# Patient Record
Sex: Female | Born: 1935
Health system: Southern US, Community
[De-identification: ages and names within clinical notes are randomized; demographics above are authoritative.]

## PROBLEM LIST (undated history)

## (undated) DIAGNOSIS — I471 Supraventricular tachycardia, unspecified: Secondary | ICD-10-CM

## (undated) DIAGNOSIS — C50919 Malignant neoplasm of unspecified site of unspecified female breast: Secondary | ICD-10-CM

## (undated) HISTORY — PX: BACK SURGERY: SHX140

## (undated) HISTORY — DX: Supraventricular tachycardia: I47.1

## (undated) HISTORY — PX: MASTECTOMY: SHX3

## (undated) HISTORY — PX: TUBAL LIGATION: SHX77

## (undated) HISTORY — DX: Supraventricular tachycardia, unspecified: I47.10

## (undated) HISTORY — DX: Malignant neoplasm of unspecified site of unspecified female breast: C50.919

## (undated) HISTORY — PX: TONSILLECTOMY: SUR1361

---

## 2007-02-01 ENCOUNTER — Encounter: Admission: RE | Admit: 2007-02-01 | Discharge: 2007-02-01 | Payer: Self-pay | Admitting: Internal Medicine

## 2007-03-14 ENCOUNTER — Ambulatory Visit: Payer: Self-pay

## 2007-03-14 ENCOUNTER — Ambulatory Visit: Payer: Self-pay | Admitting: Cardiology

## 2007-03-15 ENCOUNTER — Ambulatory Visit: Payer: Self-pay

## 2008-02-26 ENCOUNTER — Encounter: Admission: RE | Admit: 2008-02-26 | Discharge: 2008-02-26 | Payer: Self-pay | Admitting: Internal Medicine

## 2009-03-23 ENCOUNTER — Encounter: Admission: RE | Admit: 2009-03-23 | Discharge: 2009-03-23 | Payer: Self-pay | Admitting: Internal Medicine

## 2010-05-17 ENCOUNTER — Encounter: Admission: RE | Admit: 2010-05-17 | Discharge: 2010-05-17 | Payer: Self-pay | Admitting: Internal Medicine

## 2010-12-28 NOTE — Assessment & Plan Note (Signed)
[facility]                            CARDIOLOGY OFFICE NOTE   Nichole Lawrence, Nichole Lawrence                       MRN:          [id number]  DATE:03/14/2007                            DOB:          08-21-1932    CARDIOLOGY CONSULTATION   REASON FOR CONSULTATION:  Nichole Lawrence comes in today for an exercise  treadmill, ordered by Dr. Nicholos Johns, for exertional chest tightness,  shortness of breath and fatigue.   She has had this for the past several months on four different  occasions.  It happens to her when she is walking outside but not at the  [facility].  It happens about one mile into her walk.   Her cardiac risk factors are pertinent for age.  She has no history of  hypertension, diabetes, hyperlipidemia.  She does not smoke.   Her baseline EKG shows some ST segment depression in the inferior  lateral leads.  We were concerned about doing just the standard  treadmill because of the increased possibility of a false positive.  Her  history is also quite worrisome.   PAST MEDICAL HISTORY:  She has a history of rapid heart beat treated  with Cardizem and digoxin.  The specifics of this are unknown.   ALLERGIES:  In addition to the above, she has no allergy to dye.  She  has an ALLERGY to DEMEROL.   CURRENT MEDICATIONS:  1. Cardizem 360 mg a day.  2. Digoxin 0.125 mg daily.  3. Evista 160 mg daily.  4. Aspirin 81 mg daily.  5. Calcium, multivitamins, vitamin D.   PAST SURGICAL HISTORY:  She had a diskectomy in 1980.  D and C in 1987.  Vein stripping and ligation in 1972 to 1994.  Right modified radical  mastectomy.   FAMILY HISTORY:  Her family history is negative for premature coronary  artery disease.   SOCIAL HISTORY:  She is a retired Astronomer.  She is widowed.  She has two  children.  Her son-in-law is Dr. Hannah Beat, who accompanies her today.   REVIEW OF SYSTEMS:  Other than the HPI, review is negative.   PHYSICAL EXAMINATION:  VITAL SIGNS:   Her exam today demonstrated a blood  pressure of 120/70.  Her pulse was 110 in sinus tachycardia.  EKG shows  right atrial enlargement, poor R wave progression across the anterior  precordium and nonspecific ST segment changes inferolaterally.  Her  height is 5 feet 7 inches, she weighs 135 pounds.  HEENT:  Unremarkable.  NECK:  Carotid upstrokes and equal bilaterally without bruits.  No JVD,  thyroid is not enlarged.  HEART:  Reveals a normal S1 and S2.  LUNGS:  Clear.  EXTREMITIES:  Reveal no edema.  Pulses are intact.   ASSESSMENT:  Exertional dyspnea and fatigue.  This happens outside more  so than it does inside at the Y.  However, with a nonspecific  electrocardiogram, and the significant chance/pretest probability of  having some coronary disease, I think we need a stress Myoview.   PLAN:  I have scheduled this for tomorrow.  If she shows  any degree of  ischemia she will need a heart catheterization.  Otherwise, we will  continue with risk factor modification.     Thomas C. Daleen Squibb, MD, [facility]  Electronically Signed    TCW/MedQ  DD: 03/14/2007  DT: 03/15/2007  Job #: [id number]   cc:   Nichole Lawrence, M.D.

## 2011-06-15 ENCOUNTER — Other Ambulatory Visit: Payer: Self-pay | Admitting: Internal Medicine

## 2011-06-15 DIAGNOSIS — Z9011 Acquired absence of right breast and nipple: Secondary | ICD-10-CM

## 2011-06-15 DIAGNOSIS — Z1231 Encounter for screening mammogram for malignant neoplasm of breast: Secondary | ICD-10-CM

## 2011-07-14 ENCOUNTER — Ambulatory Visit
Admission: RE | Admit: 2011-07-14 | Discharge: 2011-07-14 | Disposition: A | Discharge status: Home / Self Care | Payer: Medicare Other | Source: Ambulatory Visit | Attending: Internal Medicine | Admitting: Internal Medicine

## 2011-07-14 DIAGNOSIS — Z1231 Encounter for screening mammogram for malignant neoplasm of breast: Secondary | ICD-10-CM

## 2011-07-14 DIAGNOSIS — Z9011 Acquired absence of right breast and nipple: Secondary | ICD-10-CM

## 2012-06-21 ENCOUNTER — Other Ambulatory Visit: Payer: Self-pay | Admitting: Internal Medicine

## 2012-06-21 DIAGNOSIS — Z1231 Encounter for screening mammogram for malignant neoplasm of breast: Secondary | ICD-10-CM

## 2012-06-21 DIAGNOSIS — Z9011 Acquired absence of right breast and nipple: Secondary | ICD-10-CM

## 2012-07-27 ENCOUNTER — Ambulatory Visit: Payer: Medicare Other

## 2012-07-27 ENCOUNTER — Ambulatory Visit
Admission: RE | Admit: 2012-07-27 | Discharge: 2012-07-27 | Disposition: A | Discharge status: Home / Self Care | Payer: Medicare Other | Source: Ambulatory Visit | Attending: Internal Medicine | Admitting: Internal Medicine

## 2012-07-27 DIAGNOSIS — Z1231 Encounter for screening mammogram for malignant neoplasm of breast: Secondary | ICD-10-CM

## 2012-07-27 DIAGNOSIS — Z9011 Acquired absence of right breast and nipple: Secondary | ICD-10-CM

## 2014-07-15 ENCOUNTER — Telehealth: Payer: Self-pay | Admitting: Cardiology

## 2014-07-15 NOTE — Telephone Encounter (Signed)
Received records from [facility] (Dr Kathie Dike) for appointment with Dr Stanford Breed on 08/28/14.  Records given to [facility] (medical records) for Dr Jacalyn Lefevre schedule on 08/28/14.  lp

## 2014-07-17 ENCOUNTER — Other Ambulatory Visit (HOSPITAL_COMMUNITY): Payer: Self-pay | Admitting: Internal Medicine

## 2014-07-17 DIAGNOSIS — R0602 Shortness of breath: Secondary | ICD-10-CM

## 2014-07-18 ENCOUNTER — Other Ambulatory Visit (HOSPITAL_COMMUNITY): Payer: Self-pay | Admitting: Internal Medicine

## 2014-07-18 DIAGNOSIS — R0602 Shortness of breath: Secondary | ICD-10-CM

## 2014-07-24 ENCOUNTER — Other Ambulatory Visit (HOSPITAL_COMMUNITY): Payer: Self-pay | Admitting: Cardiology

## 2014-07-24 DIAGNOSIS — R0602 Shortness of breath: Secondary | ICD-10-CM

## 2014-07-25 ENCOUNTER — Encounter (INDEPENDENT_AMBULATORY_CARE_PROVIDER_SITE_OTHER): Payer: Medicare Other

## 2014-07-25 ENCOUNTER — Other Ambulatory Visit: Payer: Self-pay | Admitting: *Deleted

## 2014-07-25 ENCOUNTER — Ambulatory Visit (HOSPITAL_COMMUNITY): Payer: Medicare Other | Attending: Cardiovascular Disease

## 2014-07-25 ENCOUNTER — Encounter: Payer: Self-pay | Admitting: Radiology

## 2014-07-25 ENCOUNTER — Ambulatory Visit (HOSPITAL_COMMUNITY): Payer: Medicare Other

## 2014-07-25 DIAGNOSIS — Z853 Personal history of malignant neoplasm of breast: Secondary | ICD-10-CM | POA: Insufficient documentation

## 2014-07-25 DIAGNOSIS — R0602 Shortness of breath: Secondary | ICD-10-CM

## 2014-07-25 DIAGNOSIS — Z8249 Family history of ischemic heart disease and other diseases of the circulatory system: Secondary | ICD-10-CM | POA: Diagnosis not present

## 2014-07-25 DIAGNOSIS — R002 Palpitations: Secondary | ICD-10-CM

## 2014-07-25 NOTE — Progress Notes (Signed)
[facility] 24hr holter applied.

## 2014-07-25 NOTE — Progress Notes (Signed)
2D Echo completed. 07/25/2014

## 2014-08-25 NOTE — Progress Notes (Signed)
     HPI: 81 year old female for evaluation of chest pain. Patient apparently had a nuclear study in 2008 that was unremarkable. Echocardiogram December 2015 showed normal LV function, mild aortic insufficiency and mild left atrial enlargement. Patient states that for the past 3 months she has had occasional chest tightness. The tightness was substernal without significant radiation. It would occur initially with walking her dog and was relieved with rest. Not pleuritic or positional. She then noticed it with exercise. Towards Christmas she began to notice the symptoms upon awakening. They would last approximately 2 hours and then resolve spontaneously. However she has had absolutely no symptoms since Christmas. She now has no dyspnea on exertion, orthopnea, PND, syncope or exertional chest pain. She did have minimal pedal edema previously but this has resolved. She has not traveled recently.  Current Outpatient Prescriptions  Medication Sig Dispense Refill  . aspirin 81 MG tablet Take 81 mg by mouth daily.    . digoxin (LANOXIN) 0.125 MG tablet Take 0.125 mg by mouth daily.    Marland Kitchen diltiazem (CARDIZEM CD) 360 MG 24 hr capsule Take 360 mg by mouth daily.    Marland Kitchen MAGNESIUM SULFATE PO Take 250 mg by mouth daily.    . raloxifene (EVISTA) 60 MG tablet Take 60 mg by mouth daily.     No current facility-administered medications for this visit.    Allergies  Allergen Reactions  . Demerol [Meperidine]   . Nsaids Rash     Past Medical History  Diagnosis Date  . SVT (supraventricular tachycardia)   . Breast cancer     Past Surgical History  Procedure Laterality Date  . Mastectomy Right   . Back surgery    . Tonsillectomy      History   Social History  . Marital Status: Single    Spouse Name: N/A    Number of Children: 2  . Years of Education: N/A   Occupational History  . Not on file.   Social History Main Topics  . Smoking status: Never Smoker   . Smokeless tobacco: Not on file  .  Alcohol Use: 0.0 oz/week    0 Not specified per week     Comment: Occasional  . Drug Use: Not on file  . Sexual Activity: Not on file   Other Topics Concern  . Not on file   Social History Narrative  . No narrative on file    Family History  Problem Relation Age of Onset  . Heart disease Mother   . CAD Brother     Died of MI at age 75    ROS: no fevers or chills, productive cough, hemoptysis, dysphasia, odynophagia, melena, hematochezia, dysuria, hematuria, rash, seizure activity, orthopnea, PND, pedal edema, claudication. Remaining systems are negative.  Physical Exam:   Blood pressure 150/72, pulse 64, height 5\' 7"  (1.702 m), weight 132 lb 8 oz (60.102 kg).  General:  Well developed/well nourished in NAD Skin warm/dry Patient not depressed No peripheral clubbing Back-normal HEENT-normal/normal eyelids Neck supple/normal carotid upstroke bilaterally; no bruits; no JVD; no thyromegaly chest - CTA/ normal expansion CV - RRR/normal S1 and S2; no murmurs, rubs or gallops;  PMI nondisplaced Abdomen -NT/ND, no HSM, no mass, + bowel sounds, no bruit 2+ femoral pulses, no bruits Ext-no edema, chords, 2+ DP Neuro-grossly nonfocal  ECG sinus rhythm with first-degree AV block. Low voltage. Left anterior fascicular block.

## 2014-08-28 ENCOUNTER — Encounter: Payer: Self-pay | Admitting: *Deleted

## 2014-08-28 ENCOUNTER — Ambulatory Visit (INDEPENDENT_AMBULATORY_CARE_PROVIDER_SITE_OTHER): Payer: Medicare Other | Admitting: Cardiology

## 2014-08-28 ENCOUNTER — Encounter: Payer: Self-pay | Admitting: Cardiology

## 2014-08-28 VITALS — BP 150/72 | HR 64 | Ht 67.0 in | Wt 132.5 lb

## 2014-08-28 DIAGNOSIS — I471 Supraventricular tachycardia: Secondary | ICD-10-CM

## 2014-08-28 DIAGNOSIS — R072 Precordial pain: Secondary | ICD-10-CM

## 2014-08-28 DIAGNOSIS — R079 Chest pain, unspecified: Secondary | ICD-10-CM

## 2014-08-28 NOTE — Assessment & Plan Note (Signed)
Initial description somewhat concerning. However her symptoms have completely resolved and now she can exert herself completely with no chest pain. Electrocardiogram with no diagnostic ST changes. Recent echocardiogram showed normal LV function. We will plan a nuclear study for risk stratification. She really has no risk factors for pulmonary embolus.

## 2014-08-28 NOTE — Assessment & Plan Note (Signed)
Patient had supraventricular tachycardia years ago. She has had no bouts in 30 years. She has a long first-degree AV block. Tinea Cardizem but discontinue digoxin and follow.

## 2014-08-28 NOTE — Patient Instructions (Signed)
Your physician recommends that you schedule a follow-up appointment in: [place] has requested that you have en exercise stress myoview. For further information please visit [email or web address]. Please follow instruction sheet, as given.  TAKE ALL MEDICINES

## 2014-09-02 DIAGNOSIS — R319 Hematuria, unspecified: Secondary | ICD-10-CM | POA: Diagnosis not present

## 2014-09-05 ENCOUNTER — Encounter (HOSPITAL_COMMUNITY): Payer: Medicare Other

## 2014-09-10 DIAGNOSIS — R072 Precordial pain: Secondary | ICD-10-CM | POA: Diagnosis not present

## 2014-09-10 DIAGNOSIS — R319 Hematuria, unspecified: Secondary | ICD-10-CM | POA: Diagnosis not present

## 2014-09-10 DIAGNOSIS — M150 Primary generalized (osteo)arthritis: Secondary | ICD-10-CM | POA: Diagnosis not present

## 2014-09-10 DIAGNOSIS — M810 Age-related osteoporosis without current pathological fracture: Secondary | ICD-10-CM | POA: Diagnosis not present

## 2014-09-11 ENCOUNTER — Telehealth (HOSPITAL_COMMUNITY): Payer: Self-pay

## 2014-09-11 NOTE — Telephone Encounter (Signed)
Encounter complete. 

## 2014-09-16 ENCOUNTER — Ambulatory Visit (HOSPITAL_COMMUNITY)
Admission: RE | Admit: 2014-09-16 | Discharge: 2014-09-16 | Disposition: A | Discharge status: Home / Self Care | Payer: Medicare Other | Source: Ambulatory Visit | Attending: Cardiovascular Disease | Admitting: Cardiovascular Disease

## 2014-09-16 ENCOUNTER — Encounter: Payer: Self-pay | Admitting: Cardiology

## 2014-09-16 DIAGNOSIS — R9431 Abnormal electrocardiogram [ECG] [EKG]: Secondary | ICD-10-CM | POA: Diagnosis not present

## 2014-09-16 DIAGNOSIS — R079 Chest pain, unspecified: Secondary | ICD-10-CM

## 2014-09-16 DIAGNOSIS — R0602 Shortness of breath: Secondary | ICD-10-CM | POA: Insufficient documentation

## 2014-09-16 DIAGNOSIS — R42 Dizziness and giddiness: Secondary | ICD-10-CM | POA: Diagnosis not present

## 2014-09-16 DIAGNOSIS — Z8249 Family history of ischemic heart disease and other diseases of the circulatory system: Secondary | ICD-10-CM | POA: Insufficient documentation

## 2014-09-16 DIAGNOSIS — R5383 Other fatigue: Secondary | ICD-10-CM | POA: Diagnosis not present

## 2014-09-16 MED ORDER — TECHNETIUM TC 99M SESTAMIBI GENERIC - CARDIOLITE
31.8000 | Freq: Once | INTRAVENOUS | Status: AC | PRN
  Administered 2014-09-16: 31.8 via INTRAVENOUS

## 2014-09-16 MED ORDER — TECHNETIUM TC 99M SESTAMIBI GENERIC - CARDIOLITE
10.9000 | Freq: Once | INTRAVENOUS | Status: AC | PRN
  Administered 2014-09-16: 10.9 via INTRAVENOUS

## 2014-09-16 NOTE — Procedures (Addendum)
[facility] [address] [place] [address] [phone]  Cardiology Nuclear Med Study  Nichole Lawrence is a 81 y.o. female     MRN : [id number]     DOB: [birth year]  Procedure Date: 09/16/2014  Nuclear Med Background Indication for Stress Test:  Evaluation for Ischemia and Abnormal EKG History:  SVT;Pt states no prior NUC MPI for comparisson. Cardiac Risk Factors: Family History - CAD  Symptoms:  Chest Pain, DOE, Fatigue, Light-Headedness and SOB   Nuclear Pre-Procedure Caffeine/Decaff Intake:  12:30am NPO After: 10:30am   IV Site: L Forearm  IV 0.9% NS with Angio Cath:  22g  Chest Size (in):  n/a IV Started by: Rolene Course, RN  Height: 5\' 7"  (1.702 m)  Cup Size: B;Pt is s/p R mastectomy/lymphectomy; NO BP OR IV RIGHT SIDE.  BMI:  Body mass index is 20.67 kg/(m^2). Weight:  132 lb (59.875 kg)   Tech Comments:  n/a    Nuclear Med Study 1 or 2 day study: 1 day  Stress Test Type:  Stress  Order Authorizing Provider:  Kirk Ruths, MD   Resting Radionuclide: Technetium 28m Sestamibi  Resting Radionuclide Dose: 10.9 mCi   Stress Radionuclide:  Technetium 58m Sestamibi  Stress Radionuclide Dose: 31.8 mCi           Stress Protocol Rest HR: 95 Stress HR: 134  Rest BP: 137/80 Stress BP: 168/76  Exercise Time (min): 6 METS: 7.0   Predicted Max HR: 140 bpm % Max HR: 95.71 bpm Rate Pressure Product: 22512  Dose of Adenosine (mg):  n/a Dose of Lexiscan: n/a mg  Dose of Atropine (mg): n/a Dose of Dobutamine: n/a mcg/kg/min (at max HR)  Stress Test Technologist: Leane Para, CCT Nuclear Technologist: Imagene Riches, CNMT   Rest Procedure:  Myocardial perfusion imaging was performed at rest 45 minutes following the intravenous administration of Technetium 24m Sestamibi. Stress Procedure:  The patient performed treadmill exercise using a Bruce  Protocol for 6 minutes. The patient stopped due to Fatigue and SOB and denied  any chest pain.  There were no significant ST-T wave changes.  Technetium 71m Sestamibi was injected IV at peak exercise and myocardial perfusion imaging was performed after a brief delay.  Transient Ischemic Dilatation (Normal <1.22):  0.97  QGS EDV:  78 ml QGS ESV:  28 ml LV Ejection Fraction: 64%       Rest ECG: NSR - Normal EKG  Stress ECG: No significant change from baseline ECG  QPS Raw Data Images:  Normal; no motion artifact; normal heart/lung ratio. Stress Images:  Normal homogeneous uptake in all areas of the myocardium. Rest Images:  Normal homogeneous uptake in all areas of the myocardium. Subtraction (SDS):  No evidence of ischemia.  Impression Exercise Capacity:  Good exercise capacity. BP Response:  Normal blood pressure response. Clinical Symptoms:  No significant symptoms noted. ECG Impression:  No significant ST segment change suggestive of ischemia. Comparison with Prior Nuclear Study: No images to compare  Overall Impression:  Normal stress nuclear study.  LV Wall Motion:  NL LV Function; NL Wall Motion   Lorretta Harp, MD  09/16/2014 5:01 PM

## 2014-11-26 NOTE — Progress Notes (Signed)
      HPI: FU chest pain. Patient apparently had a nuclear study in 2008 that was unremarkable. Echocardiogram December 2015 showed normal LV function, mild aortic insufficiency and mild left atrial enlargement. Nuclear study February 2016 showed normal LV function and no ischemia or infarction. Since last seen the patient denies any dyspnea on exertion, orthopnea, PND, pedal edema, palpitations, syncope or exertional chest pain. She occasionally has a vague uncomfortable feeling in her chest in the morning that she attributes to anxiety. Not pleuritic or positional and not exertional.   Current Outpatient Prescriptions  Medication Sig Dispense Refill  . aspirin 81 MG tablet Take 81 mg by mouth daily.    Marland Kitchen diltiazem (CARDIZEM CD) 360 MG 24 hr capsule Take 360 mg by mouth daily.    Marland Kitchen MAGNESIUM SULFATE PO Take 250 mg by mouth daily.    . raloxifene (EVISTA) 60 MG tablet Take 60 mg by mouth daily.     No current facility-administered medications for this visit.     Past Medical History  Diagnosis Date  . SVT (supraventricular tachycardia)   . Breast cancer     Past Surgical History  Procedure Laterality Date  . Mastectomy Right   . Back surgery    . Tonsillectomy      History   Social History  . Marital Status: Single    Spouse Name: N/A  . Number of Children: 2  . Years of Education: N/A   Occupational History  . Not on file.   Social History Main Topics  . Smoking status: Never Smoker   . Smokeless tobacco: Not on file  . Alcohol Use: 0.0 oz/week    0 Standard drinks or equivalent per week     Comment: Occasional  . Drug Use: Not on file  . Sexual Activity: Not on file   Other Topics Concern  . Not on file   Social History Narrative    ROS: no fevers or chills, productive cough, hemoptysis, dysphasia, odynophagia, melena, hematochezia, dysuria, hematuria, rash, seizure activity, orthopnea, PND, pedal edema, claudication. Remaining systems are  negative.  Physical Exam: Well-developed well-nourished in no acute distress.  Skin is warm and dry.  HEENT is normal.  Neck is supple.  Chest is clear to auscultation with normal expansion.  Cardiovascular exam is regular rate and rhythm.  Abdominal exam nontender or distended. No masses palpated. Extremities show no edema. neuro grossly intact  Electrocardiogram shows sinus rhythm, first-degree AV block, left anterior fascicular block, prior septal infarct cannot be excluded.

## 2014-11-27 ENCOUNTER — Ambulatory Visit (INDEPENDENT_AMBULATORY_CARE_PROVIDER_SITE_OTHER): Payer: Medicare Other | Admitting: Cardiology

## 2014-11-27 ENCOUNTER — Encounter: Payer: Self-pay | Admitting: Cardiology

## 2014-11-27 VITALS — BP 128/60 | HR 68 | Ht 66.5 in | Wt 129.2 lb

## 2014-11-27 DIAGNOSIS — R072 Precordial pain: Secondary | ICD-10-CM | POA: Diagnosis not present

## 2014-11-27 DIAGNOSIS — I471 Supraventricular tachycardia: Secondary | ICD-10-CM | POA: Diagnosis not present

## 2014-11-27 NOTE — Patient Instructions (Signed)
Your physician wants you to follow-up in: 6 MONTHS WITH DR CRENSHAW You will receive a reminder letter in the mail two months in advance. If you don't receive a letter, please call our office to schedule the follow-up appointment.  

## 2014-11-27 NOTE — Assessment & Plan Note (Addendum)
Occasional vague uncomfortable feeling in the morning that she attributes to stress but no exertional chest pain. Electrocardiogram shows no ST changes. Nuclear study normal. Will not pursue further cardiac evaluation at this point. Follow-up in 6 months to make sure symptoms stable.

## 2014-11-27 NOTE — Assessment & Plan Note (Signed)
No recent episodes. Continue Cardizem.

## 2015-02-09 ENCOUNTER — Other Ambulatory Visit: Payer: Self-pay

## 2015-03-04 DIAGNOSIS — C4441 Basal cell carcinoma of skin of scalp and neck: Secondary | ICD-10-CM | POA: Diagnosis not present

## 2015-03-04 DIAGNOSIS — Z85828 Personal history of other malignant neoplasm of skin: Secondary | ICD-10-CM | POA: Diagnosis not present

## 2015-03-04 DIAGNOSIS — L820 Inflamed seborrheic keratosis: Secondary | ICD-10-CM | POA: Diagnosis not present

## 2015-03-04 DIAGNOSIS — L570 Actinic keratosis: Secondary | ICD-10-CM | POA: Diagnosis not present

## 2015-03-04 DIAGNOSIS — L918 Other hypertrophic disorders of the skin: Secondary | ICD-10-CM | POA: Diagnosis not present

## 2015-03-04 DIAGNOSIS — C44319 Basal cell carcinoma of skin of other parts of face: Secondary | ICD-10-CM | POA: Diagnosis not present

## 2015-03-04 DIAGNOSIS — D225 Melanocytic nevi of trunk: Secondary | ICD-10-CM | POA: Diagnosis not present

## 2015-03-04 DIAGNOSIS — D692 Other nonthrombocytopenic purpura: Secondary | ICD-10-CM | POA: Diagnosis not present

## 2015-03-17 DIAGNOSIS — Z1231 Encounter for screening mammogram for malignant neoplasm of breast: Secondary | ICD-10-CM | POA: Diagnosis not present

## 2015-03-18 DIAGNOSIS — Z780 Asymptomatic menopausal state: Secondary | ICD-10-CM | POA: Diagnosis not present

## 2015-03-18 DIAGNOSIS — R319 Hematuria, unspecified: Secondary | ICD-10-CM | POA: Diagnosis not present

## 2015-03-25 DIAGNOSIS — I471 Supraventricular tachycardia: Secondary | ICD-10-CM | POA: Diagnosis not present

## 2015-03-25 DIAGNOSIS — R319 Hematuria, unspecified: Secondary | ICD-10-CM | POA: Diagnosis not present

## 2015-05-28 DIAGNOSIS — H5213 Myopia, bilateral: Secondary | ICD-10-CM | POA: Diagnosis not present

## 2015-06-08 ENCOUNTER — Ambulatory Visit: Payer: Medicare Other | Admitting: Cardiology

## 2015-06-16 NOTE — Progress Notes (Signed)
      HPI: FU chest pain. Patient apparently had a nuclear study in 2008 that was unremarkable. Echocardiogram December 2015 showed normal LV function, mild aortic insufficiency and mild left atrial enlargement. Nuclear study February 2016 showed normal LV function and no ischemia or infarction. Since last seen Patient has developed recurrent chest pain. It is described as a tightness in her left chest. It is not pleuritic, positional, related to food. It can occur both with exertion and at rest. Typically last 30 minutes and resolves spontaneously. No associated water brash. No associated nausea or diaphoresis or dyspnea. She feels fatigued afterwards. Note there are no associated palpitations similar to symptoms she's had with SVT in the past. Otherwise no dyspnea on exertion, orthopnea, PND or pedal edema. No syncope.  Current Outpatient Prescriptions  Medication Sig Dispense Refill  . aspirin 81 MG tablet Take 81 mg by mouth daily.    Marland Kitchen diltiazem (CARDIZEM CD) 360 MG 24 hr capsule Take 360 mg by mouth daily.    Marland Kitchen MAGNESIUM SULFATE PO Take 250 mg by mouth daily.    . raloxifene (EVISTA) 60 MG tablet Take 60 mg by mouth daily.     No current facility-administered medications for this visit.     Past Medical History  Diagnosis Date  . SVT (supraventricular tachycardia) ([place])   . Breast cancer [facility])     Past Surgical History  Procedure Laterality Date  . Mastectomy Right   . Back surgery    . Tonsillectomy      Social History   Social History  . Marital Status: Single    Spouse Name: N/A  . Number of Children: 2  . Years of Education: N/A   Occupational History  . Not on file.   Social History Main Topics  . Smoking status: Never Smoker   . Smokeless tobacco: Not on file  . Alcohol Use: 0.0 oz/week    0 Standard drinks or equivalent per week     Comment: Occasional  . Drug Use: Not on file  . Sexual Activity: Not on file   Other Topics Concern  . Not on file    Social History Narrative    ROS: no fevers or chills, productive cough, hemoptysis, dysphasia, odynophagia, melena, hematochezia, dysuria, hematuria, rash, seizure activity, orthopnea, PND, pedal edema, claudication. Remaining systems are negative.  Physical Exam: Well-developed well-nourished in no acute distress.  Skin is warm and dry.  HEENT is normal.  Neck is supple.  Chest is clear to auscultation with normal expansion.  Cardiovascular exam is regular rate and rhythm.  Abdominal exam nontender or distended. No masses palpated. Extremities show no edema. neuro grossly intact  ECG Sinus rhythm, first-degree AV block, left anterior fascicular block, cannot rule out prior septal infarct.

## 2015-06-19 ENCOUNTER — Telehealth: Payer: Self-pay | Admitting: *Deleted

## 2015-06-19 ENCOUNTER — Ambulatory Visit (INDEPENDENT_AMBULATORY_CARE_PROVIDER_SITE_OTHER): Payer: Medicare Other | Admitting: Cardiology

## 2015-06-19 ENCOUNTER — Telehealth: Payer: Self-pay | Admitting: Cardiology

## 2015-06-19 ENCOUNTER — Encounter: Payer: Self-pay | Admitting: Cardiology

## 2015-06-19 ENCOUNTER — Ambulatory Visit (HOSPITAL_COMMUNITY)
Admission: RE | Admit: 2015-06-19 | Discharge: 2015-06-19 | Disposition: A | Discharge status: Home / Self Care | Payer: Medicare Other | Source: Ambulatory Visit | Attending: Cardiology | Admitting: Cardiology

## 2015-06-19 VITALS — BP 130/60 | HR 77 | Ht 66.5 in | Wt 130.7 lb

## 2015-06-19 DIAGNOSIS — R072 Precordial pain: Secondary | ICD-10-CM | POA: Insufficient documentation

## 2015-06-19 DIAGNOSIS — I471 Supraventricular tachycardia, unspecified: Secondary | ICD-10-CM

## 2015-06-19 DIAGNOSIS — R918 Other nonspecific abnormal finding of lung field: Secondary | ICD-10-CM | POA: Diagnosis not present

## 2015-06-19 DIAGNOSIS — R0602 Shortness of breath: Secondary | ICD-10-CM | POA: Diagnosis not present

## 2015-06-19 NOTE — Telephone Encounter (Signed)
Spoke with pt, aware of results. She will come to the office Monday for lab work

## 2015-06-19 NOTE — Patient Instructions (Signed)
Medication Instructions:   NO CHANGE  Testing/Procedures:  A chest x-ray takes a picture of the organs and structures inside the chest, including the heart, lungs, and blood vessels. This test can show several things, including, whether the heart is enlarges; whether fluid is building up in the lungs; and whether pacemaker / defibrillator leads are still in place. AT [facility]  Follow-Up:  Your physician recommends that you schedule a follow-up appointment in: [place]   If you need a refill on your cardiac medications before your next appointment, please call your pharmacy.

## 2015-06-19 NOTE — Assessment & Plan Note (Signed)
No recent episodes. Continue Cardizem. Note her chest pain is not associated with palpitations that she had with SVT in the past.

## 2015-06-19 NOTE — Telephone Encounter (Signed)
Cassandra called in stating that she has the results to the pt's chest x-ray that he had today. Please f/u with her  Thanks

## 2015-06-19 NOTE — Telephone Encounter (Signed)
-----   Message from Lelon Perla, MD sent at 06/19/2015  1:33 PM EDT ----- Check bmet and if renal function normal arrange chest ct with contrast Kirk Ruths

## 2015-06-19 NOTE — Telephone Encounter (Signed)
Called back, no answer when dialed.  Results already reviewed by Dr. Stanford Breed w/ recommended orders relayed to RN.

## 2015-06-19 NOTE — Assessment & Plan Note (Signed)
Patient has now developed recurrent chest pain similar to her symptoms last year. Previous nuclear study was normal. Etiology of symptoms unclear to me. Not classic for angina but cannot completely exclude. I suggested we proceed with cardiac catheterization for definitive evaluation. The risks and benefits were discussed. She is hesitant and did not want to pursue at this point but wanted to consider and discuss with her family. I also explained the risk of undiagnosed coronary disease. She still would like to wait at this point. I will check a chest x-ray. Note symptoms do not clearly sound GI related

## 2015-06-22 DIAGNOSIS — R072 Precordial pain: Secondary | ICD-10-CM | POA: Diagnosis not present

## 2015-06-23 ENCOUNTER — Other Ambulatory Visit: Payer: Self-pay | Admitting: *Deleted

## 2015-06-23 DIAGNOSIS — R9389 Abnormal findings on diagnostic imaging of other specified body structures: Secondary | ICD-10-CM

## 2015-06-23 LAB — BASIC METABOLIC PANEL
BUN: 12 mg/dL (ref 7–25)
CALCIUM: 9.6 mg/dL (ref 8.6–10.4)
CO2: 27 mmol/L (ref 20–31)
Chloride: 103 mmol/L (ref 98–110)
Creat: 0.72 mg/dL (ref 0.60–0.88)
Glucose, Bld: 88 mg/dL (ref 65–99)
Potassium: 4.2 mmol/L (ref 3.5–5.3)
SODIUM: 139 mmol/L (ref 135–146)

## 2015-06-23 NOTE — Telephone Encounter (Signed)
Labs are normal.  pt aware of results  Order for CT placed and sent for scheduling.

## 2015-06-29 ENCOUNTER — Other Ambulatory Visit: Payer: Medicare Other

## 2015-07-01 ENCOUNTER — Ambulatory Visit (INDEPENDENT_AMBULATORY_CARE_PROVIDER_SITE_OTHER)
Admission: RE | Admit: 2015-07-01 | Discharge: 2015-07-01 | Disposition: A | Discharge status: Home / Self Care | Payer: Medicare Other | Source: Ambulatory Visit | Attending: Cardiology | Admitting: Cardiology

## 2015-07-01 ENCOUNTER — Inpatient Hospital Stay: Admission: RE | Admit: 2015-07-01 | Payer: Medicare Other | Source: Ambulatory Visit

## 2015-07-01 DIAGNOSIS — R9389 Abnormal findings on diagnostic imaging of other specified body structures: Secondary | ICD-10-CM

## 2015-07-01 DIAGNOSIS — R918 Other nonspecific abnormal finding of lung field: Secondary | ICD-10-CM | POA: Diagnosis not present

## 2015-07-01 DIAGNOSIS — R938 Abnormal findings on diagnostic imaging of other specified body structures: Secondary | ICD-10-CM

## 2015-07-01 MED ORDER — IOHEXOL 300 MG/ML  SOLN
80.0000 mL | Freq: Once | INTRAMUSCULAR | Status: AC | PRN
  Administered 2015-07-01: 80 mL via INTRAVENOUS

## 2015-07-02 ENCOUNTER — Telehealth: Payer: Self-pay | Admitting: *Deleted

## 2015-07-02 DIAGNOSIS — R9389 Abnormal findings on diagnostic imaging of other specified body structures: Secondary | ICD-10-CM

## 2015-07-02 NOTE — Telephone Encounter (Signed)
-----   Message from Lelon Perla, MD sent at 07/01/2015  4:05 PM EST ----- Pulmonary evaluation Kirk Ruths

## 2015-07-21 DIAGNOSIS — H18412 Arcus senilis, left eye: Secondary | ICD-10-CM | POA: Diagnosis not present

## 2015-07-21 DIAGNOSIS — H2511 Age-related nuclear cataract, right eye: Secondary | ICD-10-CM | POA: Diagnosis not present

## 2015-07-21 DIAGNOSIS — H2512 Age-related nuclear cataract, left eye: Secondary | ICD-10-CM | POA: Diagnosis not present

## 2015-07-21 DIAGNOSIS — H18411 Arcus senilis, right eye: Secondary | ICD-10-CM | POA: Diagnosis not present

## 2015-07-27 ENCOUNTER — Ambulatory Visit: Payer: Medicare Other | Admitting: Cardiology

## 2015-07-28 ENCOUNTER — Encounter: Payer: Self-pay | Admitting: Internal Medicine

## 2015-07-28 ENCOUNTER — Telehealth: Payer: Self-pay | Admitting: *Deleted

## 2015-07-28 ENCOUNTER — Ambulatory Visit (INDEPENDENT_AMBULATORY_CARE_PROVIDER_SITE_OTHER): Payer: Medicare Other | Admitting: Internal Medicine

## 2015-07-28 VITALS — BP 112/70 | HR 74 | Ht 67.0 in | Wt 132.0 lb

## 2015-07-28 DIAGNOSIS — R0600 Dyspnea, unspecified: Secondary | ICD-10-CM | POA: Diagnosis not present

## 2015-07-28 DIAGNOSIS — R0609 Other forms of dyspnea: Secondary | ICD-10-CM

## 2015-07-28 DIAGNOSIS — R918 Other nonspecific abnormal finding of lung field: Secondary | ICD-10-CM

## 2015-07-28 NOTE — Assessment & Plan Note (Addendum)
07/28/2015  Walked RA x 3 laps @ 185 ft each stopped due to  End of study, very brisk  pace, no sob or desat    Symptoms are markedly disproportionate to objective findings and not clear this is a lung problem but pt does appear to have difficult airway management issues.   DDX of  difficult airways management all start with A and  include Adherence, Ace Inhibitors, Acid Reflux, Active Sinus Disease, Alpha 1 Antitripsin deficiency, Anxiety masquerading as Airways dz,  ABPA,  allergy(esp in young), Aspiration (esp in elderly), Adverse effects of meds,  Active smokers, A bunch of PE's (a small clot burden can't cause this syndrome unless there is already severe underlying pulm or vascular dz with poor reserve) plus two Bs  = Bronchiectasis and Beta blocker use..and one C= CHF   Adherence is always the initial "prime suspect" and is a multilayered concern that requires a "trust but verify" approach in every patient - starting with knowing how to use medications, especially inhalers, correctly, keeping up with refills and understanding the fundamental difference between maintenance and prns vs those medications only taken for a very short course and then stopped and not refilled.   ? Acid (or non-acid) GERD > always difficult to exclude as up to 75% of pts in some series report no assoc GI/ Heartburn symptoms> rec max (24h)  acid suppression and diet restrictions/ reviewed and instructions given in writing.   ? Allergies/ asthma unlikely  >  Full pft all that's needed her to complete the w/u  ? chf / ischemia > f/u Dr Stanford Breed planned   Total time devoted to counseling  = 35/60 m review case with pt/ discussion of options/alternatives/ giving and going over instructions (see avs)

## 2015-07-28 NOTE — Telephone Encounter (Signed)
-----   Message from Tanda Rockers, MD sent at 07/28/2015  1:08 PM EST -----  Based on Dr Jacalyn Lefevre notes gerd is much higher on ddx > rec Try prilosec otc 20mg   Take 30-60 min before first meal of the day and Pepcid ac (famotidine) 20 mg one @  bedtime x 4 weeks whether she thinks she needs it or not and no mint/menthol / chocolate or red wine or oil based vitamins

## 2015-07-28 NOTE — Progress Notes (Signed)
Subjective:    Patient ID: Nichole Lawrence, female    DOB: 1932/08/20,    MRN: [id number]  HPI  20 yowf never smoker with new doe x 2012 on her 4 mile walk and gradually worse to point where could only do slow walk x mile and a half so underwent cardiac eval by Nichole Lawrence which was neg so referred 07/28/2015 to pulmonary clinic.   07/28/2015 1st [facility] office visit/ Nichole Lawrence   Chief Complaint  Patient presents with  . Pulmonary Consult    Referred by Dr. Stanford Lawrence. Pt c/o chest pressure and SOB for the past 4 yrs- worse for the past year. She states that with minimal exertion such as taking a shower, she gets exhausted and "feels like not getting enough air".    no longer able to do aerobic class full 55 min, stops early due to fatigue/ sob/chest tight  and  takes up to 30 min to recover p ex assoc with mild chest heaviness  / says did not reproduce on treadmill though report says stopped p 6 min due to fatigue and sob - hx by dr Nichole Lawrence slt different in chest discomfort occurring with and without ex but not the sob, which she assured me only with exertion.  No obvious other patterns in day to day or daytime variabilty or assoc chronic cough or classically pleuritic or ex cp  subjective wheeze overt sinus or hb symptoms. No unusual exp hx or h/o childhood pna/ asthma or knowledge of premature birth.  Sleeping ok without nocturnal  or early am exacerbation  of respiratory  c/o's or need for noct saba. Also denies any obvious fluctuation of symptoms with weather or environmental changes or other aggravating or alleviating factors except as outlined above   Current Medications, Allergies, Complete Past Medical History, Past Surgical History, Family History, and Social History were reviewed in [employer] record.           Review of Systems  Constitutional: Negative for fever, chills and unexpected weight change.  HENT: Negative for congestion, dental problem, ear  pain, nosebleeds, postnasal drip, rhinorrhea, sinus pressure, sneezing, sore throat, trouble swallowing and voice change.   Eyes: Negative for visual disturbance.  Respiratory: Positive for shortness of breath. Negative for cough and choking.   Cardiovascular: Negative for chest pain and leg swelling.  Gastrointestinal: Negative for vomiting, abdominal pain and diarrhea.  Genitourinary: Negative for difficulty urinating.  Musculoskeletal: Negative for arthralgias.  Skin: Negative for rash.  Neurological: Negative for tremors, syncope and headaches.  Hematological: Does not bruise/bleed easily.       Objective:   Physical Exam   amb wf / mild pseudowheeze supine but not with rapid walk   Wt Readings from Last 3 Encounters:  07/28/15 132 lb (59.875 kg)  06/19/15 130 lb 11.2 oz (59.285 kg)  11/27/14 129 lb 3.2 oz (58.605 kg)    Vital signs reviewed   HEENT: nl dentition, turbinates, and oropharynx. Nl external ear canals without cough reflex   NECK :  without JVD/Nodes/TM/ nl carotid upstrokes bilaterally   LUNGS: no acc muscle use,  Nl contour chest which is clear to A and P bilaterally without cough on insp or exp maneuvers   CV:  RRR  no s3 or murmur or increase in P2, no edema   ABD:  soft and nontender with nl inspiratory excursion in the supine position. No bruits or organomegaly, bowel sounds nl  MS:  Nl gait/ ext warm  without deformities, calf tenderness, cyanosis or clubbing No obvious joint restrictions   SKIN: warm and dry without lesions    NEURO:  alert, approp, nl sensorium with  no motor deficits    I personally reviewed images and agree with radiology impression as follows:  CT Chest   07/01/15 Multifocal tree-in-bud nodularity, as described above, suggesting chronic atypical mycobacterial infections such as MAI.        Assessment & Plan:

## 2015-07-28 NOTE — Assessment & Plan Note (Signed)
Confirmed on CT chest 06/21/15 c/w MAI   Although there are clearly abnormalities on CT scan, they should probably be considered "microscopic" as most of the change is not apparent on plain cxr and very unlikley to have anything at all to do with symptoms.   Should she develop a chronic cough or unexplained ns/wt loss then we might consider fob/bal for dx but even then unlikely to rec any chronic rx except as a last resort as so many elderly pts do worse on meds than off  Discussed in detail all the  indications, usual  risks and alternatives  relative to the benefits with patient who agrees to proceed with conservative f/u as outlined

## 2015-07-28 NOTE — Telephone Encounter (Signed)
Spoke with the pt and notified of recs per MW  She verbalized understanding and denied any questions 

## 2015-07-28 NOTE — Patient Instructions (Addendum)
Schedule for pfts next available - ok to do at [facility]   If your breathing problem persists after your cardiac evaluation is complete you need a CPST - call Libby at [phone] to schedule  Late add:  Based on Dr Jacalyn Lefevre notes gerd is much higher on ddx > rec Try prilosec otc 20mg   Take 30-60 min before first meal of the day and Pepcid ac (famotidine) 20 mg one @  bedtime x 4 weeks whether she thinks she needs it or not and no mint/menthol / chocolate or red wine or oil based vitamins       x

## 2015-07-30 ENCOUNTER — Ambulatory Visit (HOSPITAL_COMMUNITY)
Admission: RE | Admit: 2015-07-30 | Discharge: 2015-07-30 | Disposition: A | Discharge status: Home / Self Care | Payer: Medicare Other | Source: Ambulatory Visit | Attending: Internal Medicine | Admitting: Internal Medicine

## 2015-07-30 DIAGNOSIS — R0600 Dyspnea, unspecified: Secondary | ICD-10-CM | POA: Diagnosis not present

## 2015-07-30 LAB — PULMONARY FUNCTION TEST
DL/VA % PRED: 72 %
DL/VA: 3.71 ml/min/mmHg/L
DLCO unc % pred: 54 %
DLCO unc: 15.56 ml/min/mmHg
FEF 25-75 Post: 1.44 L/sec
FEF 25-75 Pre: 1.32 L/sec
FEF2575-%Change-Post: 9 %
FEF2575-%Pred-Post: 93 %
FEF2575-%Pred-Pre: 85 %
FEV1-%CHANGE-POST: 2 %
FEV1-%PRED-POST: 90 %
FEV1-%PRED-PRE: 88 %
FEV1-PRE: 1.94 L
FEV1-Post: 2 L
FEV1FVC-%Change-Post: 2 %
FEV1FVC-%Pred-Pre: 97 %
FEV6-%Change-Post: 1 %
FEV6-%PRED-PRE: 94 %
FEV6-%Pred-Post: 96 %
FEV6-POST: 2.69 L
FEV6-PRE: 2.65 L
FEV6FVC-%Change-Post: 1 %
FEV6FVC-%PRED-POST: 105 %
FEV6FVC-%Pred-Pre: 104 %
FVC-%CHANGE-POST: 0 %
FVC-%PRED-PRE: 90 %
FVC-%Pred-Post: 91 %
FVC-POST: 2.69 L
FVC-PRE: 2.68 L
POST FEV6/FVC RATIO: 100 %
PRE FEV6/FVC RATIO: 99 %
Post FEV1/FVC ratio: 74 %
Pre FEV1/FVC ratio: 72 %
RV % pred: 149 %
RV: 3.83 L
TLC % PRED: 117 %
TLC: 6.49 L

## 2015-07-30 MED ORDER — ALBUTEROL SULFATE (2.5 MG/3ML) 0.083% IN NEBU
2.5000 mg | INHALATION_SOLUTION | Freq: Once | RESPIRATORY_TRACT | Status: AC
  Administered 2015-07-30: 2.5 mg via RESPIRATORY_TRACT

## 2015-08-19 NOTE — Progress Notes (Signed)
      HPI: FU chest pain. Echocardiogram December 2015 showed normal LV function, mild aortic insufficiency and mild left atrial enlargement. Nuclear study February 2016 showed normal LV function and no ischemia or infarction. At last office visit patient was complaining of chest pain. I recommended cardiac catheterization but the patient declined at that point. Chest CT November 2016 was suggestive of Mycobacterium avium intracellular. Seen by pulmonary. Since last seen Patient denies dyspnea on exertion, orthopnea, PND, pedal edema, syncope or exertional chest pain.  Current Outpatient Prescriptions  Medication Sig Dispense Refill  . aspirin 81 MG tablet Take 81 mg by mouth daily.    Marland Kitchen diltiazem (CARDIZEM CD) 360 MG 24 hr capsule Take 360 mg by mouth daily.    Marland Kitchen MAGNESIUM SULFATE PO Take 250 mg by mouth daily.    . Multiple Vitamin (MULTIVITAMIN) capsule Take 1 capsule by mouth daily.    . raloxifene (EVISTA) 60 MG tablet Take 60 mg by mouth daily.     No current facility-administered medications for this visit.     Past Medical History  Diagnosis Date  . SVT (supraventricular tachycardia) ([place])   . Breast cancer [facility])     Past Surgical History  Procedure Laterality Date  . Mastectomy Right   . Back surgery    . Tonsillectomy      Social History   Social History  . Marital Status: Widowed    Spouse Name: N/A  . Number of Children: 2  . Years of Education: N/A   Occupational History  . Retired Therapist, sports    Social History Main Topics  . Smoking status: Never Smoker   . Smokeless tobacco: Never Used  . Alcohol Use: 0.0 oz/week    0 Standard drinks or equivalent per week     Comment: Occasional  . Drug Use: No  . Sexual Activity: Not on file   Other Topics Concern  . Not on file   Social History Narrative    ROS: no fevers or chills, productive cough, hemoptysis, dysphasia, odynophagia, melena, hematochezia, dysuria, hematuria, rash, seizure activity, orthopnea, PND,  pedal edema, claudication. Remaining systems are negative.  Physical Exam: Well-developed well-nourished in no acute distress.  Skin is warm and dry.  HEENT is normal.  Neck is supple.  Chest is clear to auscultation with normal expansion.  Cardiovascular exam is regular rate and rhythm.  Abdominal exam nontender or distended. No masses palpated. Extremities show no edema. neuro grossly intact

## 2015-08-21 ENCOUNTER — Ambulatory Visit (INDEPENDENT_AMBULATORY_CARE_PROVIDER_SITE_OTHER): Payer: Medicare Other | Admitting: Cardiology

## 2015-08-21 ENCOUNTER — Encounter: Payer: Self-pay | Admitting: Cardiology

## 2015-08-21 VITALS — BP 114/74 | HR 80 | Ht 67.0 in | Wt 132.5 lb

## 2015-08-21 DIAGNOSIS — I471 Supraventricular tachycardia: Secondary | ICD-10-CM | POA: Diagnosis not present

## 2015-08-21 DIAGNOSIS — R072 Precordial pain: Secondary | ICD-10-CM

## 2015-08-21 NOTE — Assessment & Plan Note (Signed)
Etiology of symptoms remains unclear. However they have now resolved.We will not pursue further workup at this point. If she has recurrent symptoms in the future we will plan to proceed with catheterization.

## 2015-08-21 NOTE — Patient Instructions (Signed)
Your physician wants you to follow-up in: 6 MONTHS WITH DR CRENSHAW You will receive a reminder letter in the mail two months in advance. If you don't receive a letter, please call our office to schedule the follow-up appointment.   If you need a refill on your cardiac medications before your next appointment, please call your pharmacy.  

## 2015-08-21 NOTE — Assessment & Plan Note (Signed)
Continue Cardizem. No recent episodes.

## 2015-09-01 DIAGNOSIS — M810 Age-related osteoporosis without current pathological fracture: Secondary | ICD-10-CM | POA: Diagnosis not present

## 2015-09-01 DIAGNOSIS — I471 Supraventricular tachycardia: Secondary | ICD-10-CM | POA: Diagnosis not present

## 2015-09-01 DIAGNOSIS — Z0000 Encounter for general adult medical examination without abnormal findings: Secondary | ICD-10-CM | POA: Diagnosis not present

## 2015-09-01 DIAGNOSIS — R319 Hematuria, unspecified: Secondary | ICD-10-CM | POA: Diagnosis not present

## 2015-09-08 DIAGNOSIS — M810 Age-related osteoporosis without current pathological fracture: Secondary | ICD-10-CM | POA: Diagnosis not present

## 2015-09-08 DIAGNOSIS — M150 Primary generalized (osteo)arthritis: Secondary | ICD-10-CM | POA: Diagnosis not present

## 2015-09-08 DIAGNOSIS — R5383 Other fatigue: Secondary | ICD-10-CM | POA: Diagnosis not present

## 2015-09-08 DIAGNOSIS — I471 Supraventricular tachycardia: Secondary | ICD-10-CM | POA: Diagnosis not present

## 2015-09-14 DIAGNOSIS — H25811 Combined forms of age-related cataract, right eye: Secondary | ICD-10-CM | POA: Diagnosis not present

## 2015-09-14 DIAGNOSIS — H2511 Age-related nuclear cataract, right eye: Secondary | ICD-10-CM | POA: Diagnosis not present

## 2015-09-15 DIAGNOSIS — H2512 Age-related nuclear cataract, left eye: Secondary | ICD-10-CM | POA: Diagnosis not present

## 2015-10-05 DIAGNOSIS — H25812 Combined forms of age-related cataract, left eye: Secondary | ICD-10-CM | POA: Diagnosis not present

## 2015-10-05 DIAGNOSIS — H2512 Age-related nuclear cataract, left eye: Secondary | ICD-10-CM | POA: Diagnosis not present

## 2015-12-09 DIAGNOSIS — H43812 Vitreous degeneration, left eye: Secondary | ICD-10-CM | POA: Diagnosis not present

## 2016-02-05 DIAGNOSIS — L299 Pruritus, unspecified: Secondary | ICD-10-CM | POA: Diagnosis not present

## 2016-02-05 DIAGNOSIS — L237 Allergic contact dermatitis due to plants, except food: Secondary | ICD-10-CM | POA: Diagnosis not present

## 2016-02-11 ENCOUNTER — Encounter: Payer: Self-pay | Admitting: *Deleted

## 2016-02-23 NOTE — Progress Notes (Signed)
      HPI: FU chest pain. Echocardiogram December 2015 showed normal LV function, mild aortic insufficiency and mild left atrial enlargement. Nuclear study February 2016 showed normal LV function and no ischemia or infarction. Declined cardiac catheterization previously when had recurrent CP. Chest CT November 2016 was suggestive of Mycobacterium avium intracellular. Seen by pulmonary. Since last seen the patient has dyspnea with more extreme activities but not with routine activities. It is relieved with rest. It is not associated with chest pain. There is no orthopnea, PND or pedal edema. There is no syncope or palpitations. There is no exertional chest pain.   Current Outpatient Prescriptions  Medication Sig Dispense Refill  . aspirin 81 MG tablet Take 81 mg by mouth daily.    Marland Kitchen diltiazem (CARDIZEM CD) 360 MG 24 hr capsule Take 360 mg by mouth daily.    Marland Kitchen MAGNESIUM SULFATE PO Take 250 mg by mouth daily.    . Multiple Vitamin (MULTIVITAMIN) capsule Take 1 capsule by mouth daily.    . raloxifene (EVISTA) 60 MG tablet Take 60 mg by mouth daily.     No current facility-administered medications for this visit.     Past Medical History  Diagnosis Date  . SVT (supraventricular tachycardia) ([place])   . Breast cancer [facility])     Past Surgical History  Procedure Laterality Date  . Mastectomy Right   . Back surgery    . Tonsillectomy      Social History   Social History  . Marital Status: Widowed    Spouse Name: N/A  . Number of Children: 2  . Years of Education: N/A   Occupational History  . Retired Therapist, sports    Social History Main Topics  . Smoking status: Never Smoker   . Smokeless tobacco: Never Used  . Alcohol Use: 0.0 oz/week    0 Standard drinks or equivalent per week     Comment: Occasional  . Drug Use: No  . Sexual Activity: Not on file   Other Topics Concern  . Not on file   Social History Narrative    Family History  Problem Relation Age of Onset  . Heart disease  Mother   . CAD Brother   . Emphysema Mother     never smoked  . Rheum arthritis Mother   . Heart attack Brother 39    ROS: no fevers or chills, productive cough, hemoptysis, dysphasia, odynophagia, melena, hematochezia, dysuria, hematuria, rash, seizure activity, orthopnea, PND, pedal edema, claudication. Remaining systems are negative.  Physical Exam: Well-developed well-nourished in no acute distress.  Skin is warm and dry.  HEENT is normal.  Neck is supple.  Chest is clear to auscultation with normal expansion.  Cardiovascular exam is regular rate and rhythm.  Abdominal exam nontender or distended. No masses palpated. Extremities show no edema. neuro grossly intact  ECG - Sinus rhythm, first-degree AV block. Left axis deviation. Cannot rule out prior septal infarct.  Assessment and plan  1 Chest pain-symptoms have resolved. We will not pursue further ischemia evaluation unless recurrent in the future.  2 supraventricular tachycardia-continue Cardizem. No recent episodes. Note she does have a long first-degree AV block and I'm concerned that she may develop heart block. I will therefore decrease Cardizem from 362-40 mg daily and follow.  Kirk Ruths, MD

## 2016-02-25 ENCOUNTER — Ambulatory Visit (INDEPENDENT_AMBULATORY_CARE_PROVIDER_SITE_OTHER): Payer: Medicare Other | Admitting: Cardiology

## 2016-02-25 ENCOUNTER — Encounter: Payer: Self-pay | Admitting: Cardiology

## 2016-02-25 VITALS — BP 122/60 | HR 76 | Ht 66.0 in | Wt 132.0 lb

## 2016-02-25 DIAGNOSIS — I471 Supraventricular tachycardia: Secondary | ICD-10-CM

## 2016-02-25 DIAGNOSIS — R072 Precordial pain: Secondary | ICD-10-CM | POA: Diagnosis not present

## 2016-02-25 MED ORDER — DILTIAZEM HCL ER COATED BEADS 240 MG PO CP24: 240.0000 mg | ORAL_CAPSULE | Freq: Every day | ORAL | Status: DC

## 2016-02-25 NOTE — Patient Instructions (Signed)
Medication Instructions:   STOP DILTIAZEM 360 MG   START DILTIAZEM 240 MG ONCE DAILY  Follow-Up:  Your physician wants you to follow-up in: [place] will receive a reminder letter in the mail two months in advance. If you don't receive a letter, please call our office to schedule the follow-up appointment.   If you need a refill on your cardiac medications before your next appointment, please call your pharmacy.

## 2016-03-19 ENCOUNTER — Emergency Department (HOSPITAL_COMMUNITY)
Admission: EM | Admit: 2016-03-19 | Discharge: 2016-03-19 | Disposition: A | Discharge status: Home / Self Care | Payer: Medicare Other | Attending: Emergency Medicine | Admitting: Emergency Medicine

## 2016-03-19 ENCOUNTER — Emergency Department (HOSPITAL_COMMUNITY): Payer: Medicare Other

## 2016-03-19 ENCOUNTER — Encounter (HOSPITAL_COMMUNITY): Payer: Self-pay | Admitting: Emergency Medicine

## 2016-03-19 DIAGNOSIS — S0512XA Contusion of eyeball and orbital tissues, left eye, initial encounter: Secondary | ICD-10-CM | POA: Diagnosis not present

## 2016-03-19 DIAGNOSIS — G44319 Acute post-traumatic headache, not intractable: Secondary | ICD-10-CM | POA: Diagnosis not present

## 2016-03-19 DIAGNOSIS — Z853 Personal history of malignant neoplasm of breast: Secondary | ICD-10-CM | POA: Diagnosis not present

## 2016-03-19 DIAGNOSIS — R531 Weakness: Secondary | ICD-10-CM

## 2016-03-19 DIAGNOSIS — Z7982 Long term (current) use of aspirin: Secondary | ICD-10-CM | POA: Diagnosis not present

## 2016-03-19 DIAGNOSIS — M6281 Muscle weakness (generalized): Secondary | ICD-10-CM | POA: Diagnosis not present

## 2016-03-19 DIAGNOSIS — R110 Nausea: Secondary | ICD-10-CM | POA: Diagnosis not present

## 2016-03-19 DIAGNOSIS — G44309 Post-traumatic headache, unspecified, not intractable: Secondary | ICD-10-CM | POA: Diagnosis not present

## 2016-03-19 DIAGNOSIS — R404 Transient alteration of awareness: Secondary | ICD-10-CM | POA: Diagnosis not present

## 2016-03-19 DIAGNOSIS — S0990XA Unspecified injury of head, initial encounter: Secondary | ICD-10-CM | POA: Diagnosis not present

## 2016-03-19 DIAGNOSIS — R42 Dizziness and giddiness: Secondary | ICD-10-CM | POA: Diagnosis not present

## 2016-03-19 LAB — COMPREHENSIVE METABOLIC PANEL
ALBUMIN: 3.3 g/dL — AB (ref 3.5–5.0)
ALK PHOS: 71 U/L (ref 38–126)
ALT: 16 U/L (ref 14–54)
AST: 22 U/L (ref 15–41)
Anion gap: 6 (ref 5–15)
BILIRUBIN TOTAL: 0.9 mg/dL (ref 0.3–1.2)
BUN: 8 mg/dL (ref 6–20)
CALCIUM: 9 mg/dL (ref 8.9–10.3)
CO2: 26 mmol/L (ref 22–32)
Chloride: 106 mmol/L (ref 101–111)
Creatinine, Ser: 0.68 mg/dL (ref 0.44–1.00)
GFR calc Af Amer: >60 mL/min (ref >60)
GFR calc non Af Amer: >60 mL/min (ref >60)
GLUCOSE: 99 mg/dL (ref 65–99)
POTASSIUM: 3.9 mmol/L (ref 3.5–5.1)
Sodium: 138 mmol/L (ref 135–145)
TOTAL PROTEIN: 5.8 g/dL — AB (ref 6.5–8.1)

## 2016-03-19 LAB — URINALYSIS, ROUTINE W REFLEX MICROSCOPIC
BILIRUBIN URINE: NEGATIVE
GLUCOSE, UA: NEGATIVE mg/dL
HGB URINE DIPSTICK: NEGATIVE
KETONES UR: NEGATIVE mg/dL
Leukocytes, UA: NEGATIVE
NITRITE: NEGATIVE
PH: 8 (ref 5.0–8.0)
PROTEIN: NEGATIVE mg/dL
Specific Gravity, Urine: 1.009 (ref 1.005–1.030)

## 2016-03-19 LAB — CBC WITH DIFFERENTIAL/PLATELET
BASOS PCT: 1 %
Basophils Absolute: 0 10*3/uL (ref 0.0–0.1)
Eosinophils Absolute: 0.1 10*3/uL (ref 0.0–0.7)
Eosinophils Relative: 1 %
HEMATOCRIT: 40.5 % (ref 36.0–46.0)
Hemoglobin: 13.3 g/dL (ref 12.0–15.0)
Lymphocytes Relative: 12 %
Lymphs Abs: 0.9 10*3/uL (ref 0.7–4.0)
MCH: 31.1 pg (ref 26.0–34.0)
MCHC: 32.8 g/dL (ref 30.0–36.0)
MCV: 94.6 fL (ref 78.0–100.0)
MONO ABS: 0.6 10*3/uL (ref 0.1–1.0)
MONOS PCT: 8 %
NEUTROS ABS: 5.6 10*3/uL (ref 1.7–7.7)
Neutrophils Relative %: 78 %
Platelets: 191 10*3/uL (ref 150–400)
RBC: 4.28 MIL/uL (ref 3.87–5.11)
RDW: 13 % (ref 11.5–15.5)
WBC: 7.1 10*3/uL (ref 4.0–10.5)

## 2016-03-19 LAB — TROPONIN I: Troponin I: <0.03 ng/mL (ref <0.03)

## 2016-03-19 NOTE — ED Provider Notes (Signed)
[facility] DEPT Provider Note   CSN: [id number] Arrival date & time: 03/19/16  [id number]  First Provider Contact:  First MD Initiated Contact with Patient 03/19/16 [phone]        History   Chief Complaint Chief Complaint  Patient presents with  . Nausea    HPI Nichole Lawrence is a 82 y.o. female.  HPI Patient awakened to go to the bathroom during the night. She reports that she was very dizzy. Both the quality of lightheadedness as well as spinning quality. She became nauseated and sweaty. She was able to make it back to her room. She states however she continues to feel somewhat lightheaded and fatigued. Ears been no associated chest pain or headache. No associated dyspnea. No recent nausea vomiting or diarrhea. Patient did fall in her shower approximately one week ago. She has had a dull headache since that time. She reports is not terribly painful. She does have resolving periorbital hematoma. Patient is not on anticoagulants. Past Medical History:  Diagnosis Date  . Breast cancer ([place])   . SVT (supraventricular tachycardia) [facility])     Patient Active Problem List   Diagnosis Date Noted  . Dyspnea 07/28/2015  . Pulmonary infiltrates on CXR 07/28/2015  . Chest pain 08/28/2014  . SVT (supraventricular tachycardia) ([place]) 08/28/2014    Past Surgical History:  Procedure Laterality Date  . BACK SURGERY    . MASTECTOMY Right   . TONSILLECTOMY      OB History    No data available       Home Medications    Prior to Admission medications   Medication Sig Start Date End Date Taking? Authorizing Provider  aspirin 81 MG tablet Take 81 mg by mouth daily.   Yes Historical Provider, MD  diltiazem (CARDIZEM CD) 240 MG 24 hr capsule Take 1 capsule (240 mg total) by mouth daily. 02/25/16  Yes Lelon Perla, MD  Hypromellose (ARTIFICIAL TEARS OP) Place 1 drop into both eyes daily as needed (dry eyes).   Yes Historical Provider, MD  MAGNESIUM SULFATE PO Take 250 mg by mouth daily.    Yes Historical Provider, MD  Multiple Vitamin (MULTIVITAMIN) capsule Take 1 capsule by mouth daily.   Yes Historical Provider, MD  raloxifene (EVISTA) 60 MG tablet Take 60 mg by mouth daily.   Yes Historical Provider, MD    Family History Family History  Problem Relation Age of Onset  . Heart disease Mother   . Emphysema Mother     never smoked  . Rheum arthritis Mother   . CAD Brother   . Heart attack Brother 20    Social History Social History  Substance Use Topics  . Smoking status: Never Smoker  . Smokeless tobacco: Never Used  . Alcohol use 0.0 oz/week     Comment: Occasional     Allergies   Demerol [meperidine] and Nsaids   Review of Systems Review of Systems 10 Systems reviewed and are negative for acute change except as noted in the HPI.  Physical Exam Updated Vital Signs BP 125/69 (BP Location: Right Arm)   Pulse 78   Temp 98.3 F (36.8 C) (Oral)   Resp 18   Wt 134 lb (60.8 kg)   SpO2 96%   BMI 21.63 kg/m   Physical Exam  Constitutional: She is oriented to person, place, and time. She appears well-developed and well-nourished. No distress.  HENT:  Right Ear: External ear normal.  Left Ear: External ear normal.  Nose: Nose normal.  Mouth/Throat: Oropharynx is clear and moist.  Patient has resolving periorbital hematoma on the left. There is bluish purple discoloration but no significant swelling.  Eyes: Conjunctivae and EOM are normal. Pupils are equal, round, and reactive to light.  Neck: Neck supple.  No C-spine tenderness.  Cardiovascular: Normal rate, regular rhythm, normal heart sounds and intact distal pulses.   Pulmonary/Chest: Effort normal and breath sounds normal.  Abdominal: Soft. She exhibits no distension. There is no tenderness. There is no guarding.  Musculoskeletal: Normal range of motion. She exhibits no edema, tenderness or deformity.  Neurological: She is alert and oriented to person, place, and time. No cranial nerve deficit.  She exhibits normal muscle tone. Coordination normal.  Skin: Skin is warm and dry.  Psychiatric: She has a normal mood and affect.     ED Treatments / Results  Labs (all labs ordered are listed, but only abnormal results are displayed) Labs Reviewed  COMPREHENSIVE METABOLIC PANEL - Abnormal; Notable for the following:       Result Value   Total Protein 5.8 (*)    Albumin 3.3 (*)    All other components within normal limits  URINALYSIS, ROUTINE W REFLEX MICROSCOPIC (NOT AT [facility]) - Abnormal; Notable for the following:    APPearance HAZY (*)    All other components within normal limits  TROPONIN I  CBC WITH DIFFERENTIAL/PLATELET    EKG  EKG Interpretation  Date/Time:  Saturday March 19 2016 09:51:29 EDT Ventricular Rate:  66 PR Interval:  246 QRS Duration: 82 QT Interval:  410 QTC Calculation: 429 R Axis:   -54 Text Interpretation:  Sinus rhythm with 1st degree A-V block Left axis deviation Low voltage QRS Abnormal ECG normal. no ischemic changes Confirmed by Johnney Killian, MD, Jeannie Done [phone]) on 03/19/2016 1:02:50 PM       Radiology Ct Head Wo Contrast  Result Date: 03/19/2016 CLINICAL DATA:  Fall, bruising to LEFT eye. EXAM: CT HEAD WITHOUT CONTRAST TECHNIQUE: Contiguous axial images were obtained from the base of the skull through the vertex without intravenous contrast. COMPARISON:  None. FINDINGS: No acute intracranial hemorrhage. No focal mass lesion. No CT evidence of acute infarction. No midline shift or mass effect. No hydrocephalus. Basilar cisterns are patent. There are periventricular and subcortical white matter hypodensities. Generalized cortical atrophy. Paranasal sinuses and mastoid air cells are clear. Orbits are clear. IMPRESSION: 1. No acute intracranial findings. 2. Chronic atrophy and white matter microvascular disease. Electronically Signed   By: Suzy Bouchard M.D.   On: 03/19/2016 09:43    Procedures Procedures (including critical care time)  Medications  Ordered in ED Medications - No data to display   Initial Impression / Assessment and Plan / ED Course  I have reviewed the triage vital signs and the nursing notes.  Pertinent labs & imaging results that were available during my care of the patient were reviewed by me and considered in my medical decision making (see chart for details).  Clinical Course     Final Clinical Impressions(s) / ED Diagnoses   Final diagnoses:  General weakness  Acute post-traumatic headache, not intractable  Patient has had ongoing weakness since a fall earlier in the week. She has had dull headache. CT does not show any evidence of intracranial bleeding. Patient's neurologic examination is intact and normal. At this time, no significant metabolic derangement. Patient has not experienced chest pain or dyspnea. With orthostatic vital signs patient does have an elevation in heart rate of blood pressures remain stable. At  this time, patient is advised for slight increase in fluids over the weekend, careful attention to slow position changes and PCP follow-up next week.  New Prescriptions Discharge Medication List as of 03/19/2016  1:01 PM       Charlesetta Shanks, MD 03/20/16 1132

## 2016-03-19 NOTE — ED Triage Notes (Signed)
Had sudden onset dizziness, nausea, diaphoresis.  Continues to feel "light headed."  Denies any other symptoms.  Old contusions from fall in shower last week.

## 2016-03-21 ENCOUNTER — Telehealth: Payer: Self-pay | Admitting: Cardiology

## 2016-03-21 MED ORDER — DILTIAZEM HCL ER COATED BEADS 180 MG PO CP24: 180.0000 mg | ORAL_CAPSULE | Freq: Every day | ORAL | 12 refills | Status: DC

## 2016-03-21 NOTE — Telephone Encounter (Signed)
Change cardizem to 180 mg daily; increase po fluid intake Nichole Lawrence

## 2016-03-21 NOTE — Telephone Encounter (Signed)
Spoke with pt, Aware of dr crenshaw's recommendations. New script sent to the pharmacy  

## 2016-03-21 NOTE — Telephone Encounter (Signed)
New message       Pt c/o medication issue:  1. Name of Medication: cardizem 2. How are you currently taking this medication (dosage and times per day)? 240 3. Are you having a reaction (difficulty breathing--STAT)? no 4. What is your medication issue? Pt c/o dizziness, nausea and sweating while on medication/  Please advise

## 2016-03-21 NOTE — Telephone Encounter (Signed)
Returned call to patient. She had episode of dizziness, nausea and sweating on Saturday at which her daughter called EMS and pt was evaluated in the ED. They treated her for dehydration per patient. Patient was told to call Dr Stanford Breed to discuss her medications. Patient states she has been feeling the dizziness with changing positions from lying to sitting to standing and therefore changes positions slowly.  Will route to Dr Stanford Breed for advice.

## 2016-04-20 DIAGNOSIS — L821 Other seborrheic keratosis: Secondary | ICD-10-CM | POA: Diagnosis not present

## 2016-04-20 DIAGNOSIS — D225 Melanocytic nevi of trunk: Secondary | ICD-10-CM | POA: Diagnosis not present

## 2016-04-20 DIAGNOSIS — C44519 Basal cell carcinoma of skin of other part of trunk: Secondary | ICD-10-CM | POA: Diagnosis not present

## 2016-04-20 DIAGNOSIS — Z85828 Personal history of other malignant neoplasm of skin: Secondary | ICD-10-CM | POA: Diagnosis not present

## 2016-04-20 DIAGNOSIS — D692 Other nonthrombocytopenic purpura: Secondary | ICD-10-CM | POA: Diagnosis not present

## 2016-04-20 DIAGNOSIS — D485 Neoplasm of uncertain behavior of skin: Secondary | ICD-10-CM | POA: Diagnosis not present

## 2016-08-24 DIAGNOSIS — Z853 Personal history of malignant neoplasm of breast: Secondary | ICD-10-CM | POA: Diagnosis not present

## 2016-08-24 DIAGNOSIS — Z1231 Encounter for screening mammogram for malignant neoplasm of breast: Secondary | ICD-10-CM | POA: Diagnosis not present

## 2016-09-27 DIAGNOSIS — Z23 Encounter for immunization: Secondary | ICD-10-CM | POA: Diagnosis not present

## 2016-09-27 DIAGNOSIS — R5383 Other fatigue: Secondary | ICD-10-CM | POA: Diagnosis not present

## 2016-09-27 DIAGNOSIS — I471 Supraventricular tachycardia: Secondary | ICD-10-CM | POA: Diagnosis not present

## 2016-09-27 DIAGNOSIS — M810 Age-related osteoporosis without current pathological fracture: Secondary | ICD-10-CM | POA: Diagnosis not present

## 2016-09-27 DIAGNOSIS — Z0000 Encounter for general adult medical examination without abnormal findings: Secondary | ICD-10-CM | POA: Diagnosis not present

## 2016-09-27 DIAGNOSIS — M150 Primary generalized (osteo)arthritis: Secondary | ICD-10-CM | POA: Diagnosis not present

## 2017-01-11 DIAGNOSIS — H5213 Myopia, bilateral: Secondary | ICD-10-CM | POA: Diagnosis not present

## 2017-03-17 NOTE — Progress Notes (Signed)
      HPI: FU chest pain and SVT. Echocardiogram December 2015 showed normal LV function, mild aortic insufficiency and mild left atrial enlargement. Nuclear study February 2016 showed normal LV function and no ischemia or infarction. Chest CT November 2016 was suggestive of Mycobacterium avium intracellular. Since last seen she notes increased dyspnea and chest tightness with exertion relieved with rest. She has not had symptoms at rest. Her chest tightness is not pleuritic. She denies orthopnea, PND or pedal edema. She has a nonproductive cough.   Current Outpatient Prescriptions  Medication Sig Dispense Refill  . aspirin 81 MG tablet Take 81 mg by mouth daily.    Marland Kitchen MAGNESIUM SULFATE PO Take 250 mg by mouth daily.    . Multiple Vitamin (MULTIVITAMIN) capsule Take 1 capsule by mouth daily.    . raloxifene (EVISTA) 60 MG tablet Take 60 mg by mouth daily.    Marland Kitchen diltiazem (CARDIZEM CD) 180 MG 24 hr capsule Take 1 capsule (180 mg total) by mouth daily. 30 capsule 12   No current facility-administered medications for this visit.      Past Medical History:  Diagnosis Date  . Breast cancer ([place])   . SVT (supraventricular tachycardia) ([facility])     Past Surgical History:  Procedure Laterality Date  . BACK SURGERY    . MASTECTOMY Right   . TONSILLECTOMY      Social History   Social History  . Marital status: Widowed    Spouse name: N/A  . Number of children: 2  . Years of education: N/A   Occupational History  . Retired Therapist, sports    Social History Main Topics  . Smoking status: Never Smoker  . Smokeless tobacco: Never Used  . Alcohol use 0.0 oz/week     Comment: Occasional  . Drug use: No  . Sexual activity: Not on file   Other Topics Concern  . Not on file   Social History Narrative  . No narrative on file    Family History  Problem Relation Age of Onset  . Heart disease Mother   . Emphysema Mother        never smoked  . Rheum arthritis Mother   . CAD Brother   . Heart  attack Brother 29    ROS: no fevers or chills, productive cough, hemoptysis, dysphasia, odynophagia, melena, hematochezia, dysuria, hematuria, rash, seizure activity, orthopnea, PND, pedal edema, claudication. Remaining systems are negative.  Physical Exam: Well-developed well-nourished in no acute distress.  Skin is warm and dry.  HEENT is normal.  Neck is supple.  Chest is clear to auscultation with normal expansion.  Cardiovascular exam is regular rate and rhythm.  Abdominal exam nontender or distended. No masses palpated. Extremities show no edema. neuro grossly intact  ECG- normal sinus rhythm with first-degree AV block, left axis deviation, poor R-wave progression. personally reviewed  A/P  1 Chest pain-patient has chest tightness with exertion relieved with rest. This may be secondary to her pulmonary disease or she certainly could have coronary disease. We discussed options today including functional study versus catheterization. We will plan to screen for coronary disease with a Lexiscan nuclear study.  2 dyspnea-I'm concerned this may be secondary to pulmonary disease given previous CT scan. I will ask pulmonary to review. Question MAI and need for therapy.  3 SVT-no recent episodes; continue cardizem 240 mg daily.  Kirk Ruths, MD

## 2017-03-27 ENCOUNTER — Ambulatory Visit (INDEPENDENT_AMBULATORY_CARE_PROVIDER_SITE_OTHER): Payer: Medicare Other | Admitting: Cardiology

## 2017-03-27 ENCOUNTER — Encounter: Payer: Self-pay | Admitting: Cardiology

## 2017-03-27 VITALS — BP 116/50 | HR 71 | Ht 66.5 in | Wt 132.6 lb

## 2017-03-27 DIAGNOSIS — I471 Supraventricular tachycardia: Secondary | ICD-10-CM

## 2017-03-27 DIAGNOSIS — R0789 Other chest pain: Secondary | ICD-10-CM

## 2017-03-27 DIAGNOSIS — R0602 Shortness of breath: Secondary | ICD-10-CM

## 2017-03-27 NOTE — Patient Instructions (Signed)
Medication Instructions:   NO CHANGE  Testing/Procedures:  Your physician has requested that you have a lexiscan myoview. For further information please visit [email or web address]. Please follow instruction sheet, as given.    Follow-Up:  Your physician recommends that you schedule a follow-up appointment in: 8-12 [place]- DO NOT SCHEDULE WITH DR Melvyn Novas

## 2017-03-28 ENCOUNTER — Ambulatory Visit (HOSPITAL_COMMUNITY)
Admission: RE | Admit: 2017-03-28 | Discharge: 2017-03-28 | Disposition: A | Discharge status: Home / Self Care | Payer: Medicare Other | Source: Ambulatory Visit | Attending: Cardiovascular Disease | Admitting: Cardiovascular Disease

## 2017-03-28 DIAGNOSIS — R0789 Other chest pain: Secondary | ICD-10-CM | POA: Diagnosis not present

## 2017-03-28 LAB — MYOCARDIAL PERFUSION IMAGING
CHL CUP NUCLEAR SSS: 5
CSEPPHR: 98 {beats}/min
LVDIAVOL: 68 mL (ref 46–106)
LVSYSVOL: 19 mL
Rest HR: 78 {beats}/min
SDS: 0
SRS: 5
TID: 1.13

## 2017-03-28 MED ORDER — REGADENOSON 0.4 MG/5ML IV SOLN
0.4000 mg | Freq: Once | INTRAVENOUS | Status: AC
  Administered 2017-03-28: 0.4 mg via INTRAVENOUS

## 2017-03-28 MED ORDER — TECHNETIUM TC 99M TETROFOSMIN IV KIT
30.3000 | PACK | Freq: Once | INTRAVENOUS | Status: AC | PRN
  Administered 2017-03-28: 30.3 via INTRAVENOUS
  Filled 2017-03-28: qty 31

## 2017-03-28 MED ORDER — TECHNETIUM TC 99M TETROFOSMIN IV KIT
10.1000 | PACK | Freq: Once | INTRAVENOUS | Status: AC | PRN
  Administered 2017-03-28: 10.1 via INTRAVENOUS
  Filled 2017-03-28: qty 11

## 2017-04-17 ENCOUNTER — Other Ambulatory Visit: Payer: Self-pay | Admitting: Cardiology

## 2017-05-10 NOTE — Progress Notes (Signed)
Subjective:    Patient ID: Nichole Lawrence, female    DOB: 02-21-1933, 83 y.o.   MRN: [id number]  HPI She reports she developed relatively acute breathing problems about 2 years ago. She previously was walking 2-3 miles every morning. She reports she noticed it first during her regular walk. The dyspnea was occurring with exertion and improved with rest. She reports she felt "fatigued". She reports she then began noticing it during her routine aerobics classes. She reports she started exercising less and walking shorter distances. She reports her degree of dyspnea seems to be progressively worsening such that she has trouble dicing vegetables now and even sweeping her driveway. She notices it more when she uses her upper body now. She does notice that her breathing is worse around perfumes and when in air conditioning. She has a "dry" cough as well as wheezing, especially when she wakes up in the morning. Her cough & wheezing seem to improve as the day goes on. She does have chest tightness with her dyspnea but no chest pain or pressure. She feels she has trouble getting air in. She does bruise easily but no rashes. She reports some mild joint pain in her fingers but no stiffness, swelling, or erythema. No dysphagia or odynophagia. No reflux or dyspepsia. She does have dry eyes. No dry mouth. No oral ulcers. She has had pleurisy in the past as well as "acute asthma" and bronchitis. She has had pneumonia twice in the past. No asthma, allergies, or breathing problems as a child.   Review of Systems No fever, chills, or sweats. She does have some seasonal sinus congestion & drainage. A pertinent 14 point review of systems is negative except as per the history of presenting illness.  Allergies  Allergen Reactions  . Demerol [Meperidine] Nausea And Vomiting  . Nsaids Rash    Current Outpatient Prescriptions on File Prior to Visit  Medication Sig Dispense Refill  . aspirin 81 MG tablet Take 81 mg by  mouth daily.    Marland Kitchen CARTIA XT 180 MG 24 hr capsule TAKE ONE CAPSULE BY MOUTH ONCE DAILY 30 capsule 12  . MAGNESIUM SULFATE PO Take 250 mg by mouth daily.    . Multiple Vitamin (MULTIVITAMIN) capsule Take 1 capsule by mouth daily.    . raloxifene (EVISTA) 60 MG tablet Take 60 mg by mouth daily.     No current facility-administered medications on file prior to visit.     Past Medical History:  Diagnosis Date  . Breast cancer ([facility])   . SVT (supraventricular tachycardia) ([facility])     Past Surgical History:  Procedure Laterality Date  . BACK SURGERY    . MASTECTOMY Right   . TONSILLECTOMY    . TUBAL LIGATION      Family History  Problem Relation Age of Onset  . Heart disease Mother   . Emphysema Mother        never smoked  . Rheum arthritis Mother   . Breast cancer Mother   . Lung cancer Father   . CAD Brother     Social History   Social History  . Marital status: Widowed    Spouse name: N/A  . Number of children: 2  . Years of education: N/A   Occupational History  . Retired Therapist, sports    Social History Main Topics  . Smoking status: Passive Smoke Exposure - Never Smoker    Types: Cigarettes  . Smokeless tobacco: Never Used     Comment:  Husband and Father   . Alcohol use 0.0 oz/week     Comment: Occasional  . Drug use: No  . Sexual activity: Not Asked   Other Topics Concern  . None   Social History Narrative   [facility] (05/11/17):   Originally from [place]. [place]. Moved to [place] in 2006. Primarily worked as an Therapist, sports in the E.D., PACU, and in nursery. No known TB exposure. No mold, bird, or hot tub exposure. Remotely had a swimming pool. Enjoys exercising and walking. She does have a dog and enjoys gardening.       Objective:   Physical Exam BP 130/70 (BP Location: Right Arm, Cuff Size: Normal)   Pulse 93   Ht 5' 6.5" (1.689 m)   Wt 132 lb 8 oz (60.1 kg)   SpO2 94%   BMI 21.07 kg/m  General:  Awake. Alert. No acute distress. Accompanied by  daughter.  Integument:  Warm & dry. No rash on exposed skin.  Extremities:  No cyanosis or clubbing.  Lymphatics:  No appreciated cervical or supraclavicular lymphadenoapthy. HEENT:  Moist mucus membranes. No oral ulcers. No scleral injection or icterus. Minimal nasal turbinate swelling. Cardiovascular:  Regular rate and rhythm . No edema. No appreciable JVD.  Pulmonary:  Good aeration & clear to auscultation bilaterally.  questionable right basilar crackles. Symmetric chest wall expansion. No accessory muscle use on room air. Abdomen: Soft. Normal bowel sounds. Nondistended. Grossly nontender. Musculoskeletal:  Normal bulk and tone. Hand grip strength 5/5 bilaterally. No joint deformity or effusion appreciated. Minimal synovial thickening of bilateral PIP & DIP joints.  Neurological:  CN 2-12 grossly in tact. No meningismus. Moving all 4 extremities equally. Symmetric  biceps & brachioradialis deep tendon reflexes. Psychiatric:  Mood and affect congruent. Speech normal rhythm, rate & tone.   PFT 07/30/15: FVC 2.68 L (90%) FEV1 1.94 L (88%) FEV1/FVC 0.72 FEF 25-75 1.32 L (85%) negative bronchodilator response TLC 6.49 L (117%) RV 149% ERV 117% DLCO uncorrected 54%  IMAGING CT CHEST W/ CONTRAST 07/01/15 (personally reviewed by me):  Checking but nodularity noted in bilateral lungs and multiple lobes upper, middle, lingula, and lower. No pathologic mediastinal adenopathy. Largest nodule measures 1 cm in right upper lobe with some spiculation. No pleural effusion or thickening. No pericardial effusion. very mild apical predominant emphysema. Mild pectus excavatum.   CARDIAC TTE (07/25/14):  LV normal in size with EF 55-60%. Normal wall thickness. LA mildly dilated & RA normal in size. RV normal in size and function. Aortic valve poorly visualized with mild regurgitation. Aortic root normal in size. Trivial mitral regurgitation. Trivial pulmonic regurgitation. Mild tricuspid regurgitation.       Assessment & Plan:  83 y.o. Female with long-standing and progressive history of dyspnea on exertion. Reviewed patient's previous pulmonary function testing which does show air trapping as well as a moderately reduced carbon monoxide diffusion capacity. This could be consistent with the emphysema. Certainly this could be due to her prior secondhand tobacco exposure. Alternatively, with her family history of emphysema alpha-1 antitrypsin deficiency is certainly possible. We did discuss the possibility of other autoimmune diseases such as rheumatoid given her history of rheumatoid in the family as well as her parenchymal lung nodules. Certainly the nodules could be due to an atypical infection but she has no symptoms that would suggest as such. I instructed the patient to contact my office if she had any new breathing problems or questions before her next appointment.  1. Dyspnea  on exertion: Suspect secondary to underlying parenchymal lung disease. Checking 6 minute walk test on room air. 2. Arthritis: Checking serum ANA, ESR, CRP, anti-CCP, and rheumatoid factor. 3. Multiple lung nodules on CT: Checking CT chest without contrast as soon as possible. 4. Pulmonary emphysema: Holding on inhaler therapy. Screening for alpha-1 antitrypsin deficiency. Checking full pulmonary function testing. 5. Follow-up: Patient to return to clinic in 4 weeks or sooner if needed.  Sonia Baller Ashok Cordia, M.D. [facility] Pager:  [phone] After 3pm or if no response, call [phone] 4:04 PM 05/11/17

## 2017-05-11 ENCOUNTER — Ambulatory Visit (INDEPENDENT_AMBULATORY_CARE_PROVIDER_SITE_OTHER): Payer: Medicare Other | Admitting: Pulmonary Disease

## 2017-05-11 ENCOUNTER — Encounter: Payer: Self-pay | Admitting: Pulmonary Disease

## 2017-05-11 ENCOUNTER — Other Ambulatory Visit (INDEPENDENT_AMBULATORY_CARE_PROVIDER_SITE_OTHER): Payer: Medicare Other

## 2017-05-11 VITALS — BP 130/70 | HR 93 | Ht 66.5 in | Wt 132.5 lb

## 2017-05-11 DIAGNOSIS — R0600 Dyspnea, unspecified: Secondary | ICD-10-CM

## 2017-05-11 DIAGNOSIS — R918 Other nonspecific abnormal finding of lung field: Secondary | ICD-10-CM

## 2017-05-11 DIAGNOSIS — R0609 Other forms of dyspnea: Secondary | ICD-10-CM | POA: Diagnosis not present

## 2017-05-11 DIAGNOSIS — R0602 Shortness of breath: Secondary | ICD-10-CM

## 2017-05-11 DIAGNOSIS — J3089 Other allergic rhinitis: Secondary | ICD-10-CM

## 2017-05-11 DIAGNOSIS — J302 Other seasonal allergic rhinitis: Secondary | ICD-10-CM | POA: Insufficient documentation

## 2017-05-11 DIAGNOSIS — M1990 Unspecified osteoarthritis, unspecified site: Secondary | ICD-10-CM | POA: Diagnosis not present

## 2017-05-11 LAB — SEDIMENTATION RATE: Sed Rate: 3 mm/hr (ref 0–30)

## 2017-05-11 LAB — C-REACTIVE PROTEIN: CRP: 0.3 mg/dL — ABNORMAL LOW (ref 0.5–20.0)

## 2017-05-11 NOTE — Patient Instructions (Addendum)
   Call or e-mail me if you have any new breathing problems or questions before your next appointment.  I will see you back in 4 weeks to review your test results.  TESTS ORDERED: 1. CT Chest w/o asap 2. Full PFTs on or before next appointment 3. 6MWT on room air on or before next appointment 4. Serum Alpha-1 Antitrypsin Phenotype, ANA, ESR, CRP, Anti-CCP, & Rheumatoid Factor.

## 2017-05-12 ENCOUNTER — Ambulatory Visit (INDEPENDENT_AMBULATORY_CARE_PROVIDER_SITE_OTHER)
Admission: RE | Admit: 2017-05-12 | Discharge: 2017-05-12 | Disposition: A | Discharge status: Home / Self Care | Payer: Medicare Other | Source: Ambulatory Visit | Attending: Pulmonary Disease | Admitting: Pulmonary Disease

## 2017-05-12 DIAGNOSIS — R0602 Shortness of breath: Secondary | ICD-10-CM

## 2017-05-12 DIAGNOSIS — R911 Solitary pulmonary nodule: Secondary | ICD-10-CM | POA: Diagnosis not present

## 2017-05-14 LAB — ANTI-NUCLEAR AB-TITER (ANA TITER): ANA Titer 1: 1:160 {titer} — ABNORMAL HIGH

## 2017-05-14 LAB — RHEUMATOID FACTOR: Rhuematoid fact SerPl-aCnc: <14 IU/mL (ref <14)

## 2017-05-14 LAB — ALPHA-1 ANTITRYPSIN PHENOTYPE: A-1 Antitrypsin, Ser: 154 mg/dL (ref 83–199)

## 2017-05-14 LAB — ANA: ANA: POSITIVE — AB

## 2017-05-14 LAB — CYCLIC CITRUL PEPTIDE ANTIBODY, IGG

## 2017-06-12 ENCOUNTER — Ambulatory Visit (INDEPENDENT_AMBULATORY_CARE_PROVIDER_SITE_OTHER): Payer: Medicare Other | Admitting: Pulmonary Disease

## 2017-06-12 ENCOUNTER — Ambulatory Visit (INDEPENDENT_AMBULATORY_CARE_PROVIDER_SITE_OTHER): Payer: Medicare Other | Admitting: *Deleted

## 2017-06-12 VITALS — BP 118/60 | HR 80 | Ht 67.0 in | Wt 136.0 lb

## 2017-06-12 DIAGNOSIS — R918 Other nonspecific abnormal finding of lung field: Secondary | ICD-10-CM

## 2017-06-12 DIAGNOSIS — J439 Emphysema, unspecified: Secondary | ICD-10-CM | POA: Diagnosis not present

## 2017-06-12 DIAGNOSIS — R0600 Dyspnea, unspecified: Secondary | ICD-10-CM

## 2017-06-12 DIAGNOSIS — R0609 Other forms of dyspnea: Secondary | ICD-10-CM

## 2017-06-12 LAB — PULMONARY FUNCTION TEST
DL/VA % pred: 81 %
DL/VA: 4.19 ml/min/mmHg/L
DLCO COR % PRED: 53 %
DLCO COR: 15.24 ml/min/mmHg
DLCO UNC: 15 ml/min/mmHg
DLCO unc % pred: 53 %
FEF 25-75 Post: 2.22 L/sec
FEF 25-75 Pre: 1.7 L/sec
FEF2575-%Change-Post: 30 %
FEF2575-%Pred-Post: 153 %
FEF2575-%Pred-Pre: 118 %
FEV1-%Change-Post: 5 %
FEV1-%PRED-POST: 87 %
FEV1-%PRED-PRE: 82 %
FEV1-POST: 1.86 L
FEV1-Pre: 1.76 L
FEV1FVC-%CHANGE-POST: -5 %
FEV1FVC-%Pred-Pre: 113 %
FEV6-%Change-Post: 11 %
FEV6-%PRED-PRE: 78 %
FEV6-%Pred-Post: 87 %
FEV6-POST: 2.38 L
FEV6-PRE: 2.13 L
FEV6FVC-%Change-Post: 0 %
FEV6FVC-%PRED-POST: 105 %
FEV6FVC-%PRED-PRE: 105 %
FVC-%Change-Post: 12 %
FVC-%PRED-POST: 83 %
FVC-%PRED-PRE: 74 %
FVC-POST: 2.38 L
FVC-PRE: 2.13 L
POST FEV6/FVC RATIO: 100 %
PRE FEV1/FVC RATIO: 83 %
Post FEV1/FVC ratio: 78 %
Pre FEV6/FVC Ratio: 100 %
RV % pred: 134 %
RV: 3.51 L
TLC % PRED: 103 %
TLC: 5.67 L

## 2017-06-12 MED ORDER — UMECLIDINIUM-VILANTEROL 62.5-25 MCG/INH IN AEPB: 1.0000 | INHALATION_SPRAY | Freq: Every day | RESPIRATORY_TRACT | 0 refills | Status: DC

## 2017-06-12 MED ORDER — UMECLIDINIUM-VILANTEROL 62.5-25 MCG/INH IN AEPB: 1.0000 | INHALATION_SPRAY | Freq: Every day | RESPIRATORY_TRACT | 6 refills | Status: DC

## 2017-06-12 NOTE — Progress Notes (Signed)
Subjective:    Patient ID: Nichole Lawrence, female    DOB: 1933-05-03, 83 y.o.   MRN: [id number]  C.C.:  Follow-up for Pulmonary Emphysema & Multiple Lung Nodules.   HPI Pulmonary Emphysema: No evidence of alpha-1 antitrypsin deficiency. Patient has no significant prior history of tobacco smoke exposure. Does have history of secondhand smoke exposure. Significant bronchodilator response on spirometry today but no evidence of airway obstruction. She still has dyspnea, mainly in the morning, especially with dressing. Wheezing is mild and mostly first thing in the morning.  Multiple Lung Nodules: Patient appears to have new 6 mm left lower lobe nodule. Other areas of bronchiectasis and tree-in-bud opacities consistent with possible atypical infectious process. She reports a nonproductive cough.  Review of Systems No fever, chills, or sweats. No chest pain but does have occasional chest pressure with her dyspnea. No nausea, emesis or reflux.   Allergies  Allergen Reactions  . Demerol [Meperidine] Nausea And Vomiting  . Nsaids Rash    Current Outpatient Prescriptions on File Prior to Visit  Medication Sig Dispense Refill  . aspirin 81 MG tablet Take 81 mg by mouth daily.    Marland Kitchen CARTIA XT 180 MG 24 hr capsule TAKE ONE CAPSULE BY MOUTH ONCE DAILY 30 capsule 12  . MAGNESIUM SULFATE PO Take 250 mg by mouth daily.    . Multiple Vitamin (MULTIVITAMIN) capsule Take 1 capsule by mouth daily.    . raloxifene (EVISTA) 60 MG tablet Take 60 mg by mouth daily.     No current facility-administered medications on file prior to visit.     Past Medical History:  Diagnosis Date  . Breast cancer ([facility])   . SVT (supraventricular tachycardia) (H[facility])     Past Surgical History:  Procedure Laterality Date  . BACK SURGERY    . MASTECTOMY Right   . TONSILLECTOMY    . TUBAL LIGATION      Family History  Problem Relation Age of Onset  . Heart disease Mother   . Emphysema Mother        never smoked  .  Rheum arthritis Mother   . Breast cancer Mother   . Lung cancer Father   . CAD Brother     Social History   Social History  . Marital status: Widowed    Spouse name: N/A  . Number of children: 2  . Years of education: N/A   Occupational History  . Retired Therapist, sports    Social History Main Topics  . Smoking status: Passive Smoke Exposure - Never Smoker    Types: Cigarettes  . Smokeless tobacco: Never Used     Comment: Husband and Father   . Alcohol use 0.0 oz/week     Comment: Occasional  . Drug use: No  . Sexual activity: Not on file   Other Topics Concern  . Not on file   Social History Narrative   [facility] (05/11/17):   Originally from [place]. [place]ived near a [place]. Moved to [place] in 2006. Primarily worked as an Therapist, sports in the E.D., PACU, and in nursery. No known TB exposure. No mold, bird, or hot tub exposure. Remotely had a swimming pool. Enjoys exercising and walking. She does have a dog and enjoys gardening.       Objective:   Physical Exam BP 118/60 (BP Location: Right Arm, Cuff Size: Normal)   Pulse 80   Ht _0  (1.702 m)   Wt 136 lb (61.7 kg)   SpO2 94%  BMI 21.30 kg/m   General:  Awake. Elderly female. No distress. Integument:  Warm. Dry. No rash. Extremities:  No cyanosis or clubbing.  HEENT:  No nasal turbinate swelling. Moist mucus membranes. No scleral icterus. Cardiovascular:  Regular rate. No edema. Regular rhythm.  Pulmonary:  Clear bilaterally with auscultation. Normal work of breathing on room air. Abdomen: Soft. Normal bowel sounds. Nondistended.  Musculoskeletal:  Normal bulk and tone. No joint deformity or effusion appreciated. Neurological:  Cranial nerves 2-12 grossly in tact. No meningismus. Moving all 4 extremities equally.   PFT 06/12/17: FVC 2.13 L (74%) FEV1 1.76 L (82%) FEV1/FVC 0.83 FEF 25-75 1.70 L (118%) positive bronchodilator response TLC 5.67 L (103%) RV 134% ERV 80% DLCO corrected 53% 07/30/15: FVC 2.68 L (90%) FEV1 1.94 L (88%)  FEV1/FVC 0.72 FEF 25-75 1.32 L (85%) negative bronchodilator response TLC 6.49 L (117%) RV 149% ERV 117% DLCO uncorrected 54%  6MWT 06/12/17:  Walked 432 meters / Baseline Sat 98% on RA / Nadir Sat 97% on RA  IMAGING CT CHEST W/O 05/12/17 (personally reviewed by me):  New 6 mm nodule in left lower lobe. Diffuse bronchial wall thickening with findings consistent with bronchiectasis and right middle and upper lobes bilaterally. Tree-in-bud opacities predominantly within right upper and right middle lobes. No pleural effusion or thickening. No pericardial effusion. No pathologic mediastinal adenopathy.  CT CHEST W/ CONTRAST 07/01/15 (previously reviewed by me):  Checking but nodularity noted in bilateral lungs and multiple lobes upper, middle, lingula, and lower. No pathologic mediastinal adenopathy. Largest nodule measures 1 cm in right upper lobe with some spiculation. No pleural effusion or thickening. No pericardial effusion. very mild apical predominant emphysema. Mild pectus excavatum.   CARDIAC TTE (07/25/14):  LV normal in size with EF 55-60%. Normal wall thickness. LA mildly dilated & RA normal in size. RV normal in size and function. Aortic valve poorly visualized with mild regurgitation. Aortic root normal in size. Trivial mitral regurgitation. Trivial pulmonic regurgitation. Mild tricuspid regurgitation.   LABS 05/11/17 Alpha-1 antitrypsin: MM (154) ESR: 3 CRP: 0.3 ANA:  1:160 RA:  <14 Anti-CCP:  <16    Assessment & Plan:  83 y.o. female with long-standing history of dyspnea on exertion. Imaging doesn't demonstrate pulmonary emphysema and spirometry today has a significant bronchodilator response. Even so, she has no airway obstruction to suggest COPD. She does have air trapping on her lung volumes consistent with her emphysema. Despite this her walk test shows no significant desaturation or oxygen requirement. We reviewed her chest CT scan which does suggest underlying atypical  infection. We also addressed her left lower lobe nodule. The patient declines further imaging or invasive testing at this time. She is aware of the risk of malignancy and further progression. I instructed the patient to contact my office if she had questions or concerns before her next appointment.  1. Pulmonary emphysema:Trying patient on Anoro. She will contact my office if she has any questions or concerns. 2. Multiple lung nodules:  Likely atypical infection. Patient declines further imaging. Deferring bronchoscopy. 3. Health maintenance: Status post unspecified pneumonia vaccine January 2014. She reports she had Flu Vaccine 2-3 weeks ago.  4. Follow-up: Return to clinic in 6 months or sooner if needed.  Sonia Baller Ashok Cordia, M.D. [facility] Critical [facility] Pager:  [phone] After 3pm or if no response, call [phone] 4:58 PM 06/12/17

## 2017-06-12 NOTE — Progress Notes (Signed)
SIX MIN WALK 06/12/2017 07/28/2015  Medications Cartia 180mg , Evista 60mg  -  Supplimental Oxygen during Test? (L/min) No No  Laps 9 -  Partial Lap (in Meters) 0 -  Baseline BP (sitting) 124/60 -  Baseline Heartrate 79 -  Baseline Dyspnea (Borg Scale) 1 -  Baseline Fatigue (Borg Scale) 2 -  Baseline SPO2 98 -  BP (sitting) 136/70 -  Heartrate 105 -  Dyspnea (Borg Scale) 2 -  Fatigue (Borg Scale) 2 -  SPO2 97 -  BP (sitting) 128/60 -  Heartrate 74 -  SPO2 99 -  Stopped or Paused before Six Minutes No -  Distance Completed 432 -  Tech Comments: TA/CMA fast pace/min SOB//lmr

## 2017-06-12 NOTE — Patient Instructions (Addendum)
   Call if you have any new breathing problems or questions before your next appointment.  Use the Anoro Ellipta by doing 1 puff once daily. I'm sending in a prescription to your pharmacy.  We will see you back in 6 months or sooner if needed.

## 2017-06-12 NOTE — Patient Instructions (Signed)
PFT done today. 

## 2017-06-16 ENCOUNTER — Encounter: Payer: Self-pay | Admitting: Cardiology

## 2017-06-20 NOTE — Progress Notes (Signed)
HPI: FU chest pain and SVT. Echocardiogram December 2015 showed normal LV function, mild aortic insufficiency and mild left atrial enlargement. Nuclear study August 2018 showed normal perfusion and ejection fraction 72%. Pulmonary function tests October 2018 showed moderately decreased DLCO and restrictive disease. Chest CT September 2018 showed bronchiectasis and pulmonary nodule in the left base. Follow-up recommended 6-12 months. LAD atherosclerosis noted. Now followed by pulmonary.  Since last seen she is much improved after initiation of bronchodilator. She denies dyspnea on exertion, orthopnea, PND, pedal edema, chest pain or syncope.  Current Outpatient Medications  Medication Sig Dispense Refill  . aspirin 81 MG tablet Take 81 mg by mouth daily.    Marland Kitchen CARTIA XT 180 MG 24 hr capsule TAKE ONE CAPSULE BY MOUTH ONCE DAILY 30 capsule 12  . MAGNESIUM SULFATE PO Take 250 mg by mouth daily.    . Multiple Vitamin (MULTIVITAMIN) capsule Take 1 capsule by mouth daily.    . raloxifene (EVISTA) 60 MG tablet Take 60 mg by mouth daily.    Marland Kitchen umeclidinium-vilanterol (ANORO ELLIPTA) 62.5-25 MCG/INH AEPB Inhale 1 puff into the lungs daily. 1 each 6   No current facility-administered medications for this visit.      Past Medical History:  Diagnosis Date  . Breast cancer ([facility])   . SVT (supraventricular tachycardia) ([facility])     Past Surgical History:  Procedure Laterality Date  . BACK SURGERY    . MASTECTOMY Right   . TONSILLECTOMY    . TUBAL LIGATION      Social History   Socioeconomic History  . Marital status: Widowed    Spouse name: Not on file  . Number of children: 2  . Years of education: Not on file  . Highest education level: Not on file  Social Needs  . Financial resource strain: Not on file  . Food insecurity - worry: Not on file  . Food insecurity - inability: Not on file  . Transportation needs - medical: Not on file  . Transportation needs - non-medical: Not on file    Occupational History  . Occupation: Retired Therapist, sports  Tobacco Use  . Smoking status: Passive Smoke Exposure - Never Smoker  . Smokeless tobacco: Never Used  . Tobacco comment: Husband and Father   Substance and Sexual Activity  . Alcohol use: Yes    Alcohol/week: 0.0 oz    Comment: Occasional  . Drug use: No  . Sexual activity: Not on file  Other Topics Concern  . Not on file  Social History Narrative   [facility] (05/11/17):   Originally from [place]. [place]ived near [place] paper [place]. Moved to [place] in 2006. Primarily worked as an Therapist, sports in the E.D., PACU, and in nursery. No known TB exposure. No mold, bird, or hot tub exposure. Remotely had a swimming pool. Enjoys exercising and walking. She does have a dog and enjoys gardening.     Family History  Problem Relation Age of Onset  . Heart disease Mother   . Emphysema Mother        never smoked  . Rheum arthritis Mother   . Breast cancer Mother   . Lung cancer Father   . CAD Brother     ROS: no fevers or chills, productive cough, hemoptysis, dysphasia, odynophagia, melena, hematochezia, dysuria, hematuria, rash, seizure activity, orthopnea, PND, pedal edema, claudication. Remaining systems are negative.  Physical Exam: Well-developed well-nourished in no acute distress.  Skin is warm and dry.  HEENT is normal.  Neck is supple.  Chest is clear to auscultation with normal expansion.  Cardiovascular exam is regular rate and rhythm.  Abdominal exam nontender or distended. No masses palpated. Extremities show no edema. neuro grossly intact   A/P  1 chest pain-recent nuclear study showed no ischemia. We will plan medical therapy. Continue aspirin.  2 dyspnea-patient with COPD and bronchiectasis. Managed by pulmonary.  3 supraventricular tachycardia-Patient has not had any recurrences. Continue Cardizem.  4 pulmonary nodules-we discussed the importance of following up with pulmonary for this issue.  Kirk Ruths, MD

## 2017-06-21 DIAGNOSIS — D692 Other nonthrombocytopenic purpura: Secondary | ICD-10-CM | POA: Diagnosis not present

## 2017-06-21 DIAGNOSIS — D0439 Carcinoma in situ of skin of other parts of face: Secondary | ICD-10-CM | POA: Diagnosis not present

## 2017-06-21 DIAGNOSIS — L814 Other melanin hyperpigmentation: Secondary | ICD-10-CM | POA: Diagnosis not present

## 2017-06-21 DIAGNOSIS — Z85828 Personal history of other malignant neoplasm of skin: Secondary | ICD-10-CM | POA: Diagnosis not present

## 2017-06-21 DIAGNOSIS — D0430 Carcinoma in situ of skin of unspecified part of face: Secondary | ICD-10-CM | POA: Diagnosis not present

## 2017-06-21 DIAGNOSIS — L821 Other seborrheic keratosis: Secondary | ICD-10-CM | POA: Diagnosis not present

## 2017-06-29 ENCOUNTER — Ambulatory Visit: Payer: Medicare Other | Admitting: Cardiology

## 2017-06-29 ENCOUNTER — Encounter: Payer: Self-pay | Admitting: Cardiology

## 2017-06-29 VITALS — BP 116/62 | HR 84 | Ht 66.5 in | Wt 133.6 lb

## 2017-06-29 DIAGNOSIS — R0789 Other chest pain: Secondary | ICD-10-CM | POA: Diagnosis not present

## 2017-06-29 DIAGNOSIS — R0602 Shortness of breath: Secondary | ICD-10-CM

## 2017-06-29 DIAGNOSIS — I471 Supraventricular tachycardia: Secondary | ICD-10-CM | POA: Diagnosis not present

## 2017-06-29 NOTE — Patient Instructions (Signed)
Your physician wants you to follow-up in: ONE YEAR WITH DR CRENSHAW You will receive a reminder letter in the mail two months in advance. If you don't receive a letter, please call our office to schedule the follow-up appointment.   If you need a refill on your cardiac medications before your next appointment, please call your pharmacy.  

## 2017-10-10 DIAGNOSIS — Z0000 Encounter for general adult medical examination without abnormal findings: Secondary | ICD-10-CM | POA: Diagnosis not present

## 2017-10-10 DIAGNOSIS — I471 Supraventricular tachycardia: Secondary | ICD-10-CM | POA: Diagnosis not present

## 2017-10-10 DIAGNOSIS — M150 Primary generalized (osteo)arthritis: Secondary | ICD-10-CM | POA: Diagnosis not present

## 2017-10-10 DIAGNOSIS — M810 Age-related osteoporosis without current pathological fracture: Secondary | ICD-10-CM | POA: Diagnosis not present

## 2017-10-17 DIAGNOSIS — M150 Primary generalized (osteo)arthritis: Secondary | ICD-10-CM | POA: Diagnosis not present

## 2017-10-17 DIAGNOSIS — I471 Supraventricular tachycardia: Secondary | ICD-10-CM | POA: Diagnosis not present

## 2017-10-17 DIAGNOSIS — Z780 Asymptomatic menopausal state: Secondary | ICD-10-CM | POA: Diagnosis not present

## 2017-10-17 DIAGNOSIS — M810 Age-related osteoporosis without current pathological fracture: Secondary | ICD-10-CM | POA: Diagnosis not present

## 2017-12-11 ENCOUNTER — Ambulatory Visit: Payer: Medicare Other | Admitting: Acute Care

## 2017-12-11 ENCOUNTER — Encounter: Payer: Self-pay | Admitting: Acute Care

## 2017-12-11 DIAGNOSIS — J438 Other emphysema: Secondary | ICD-10-CM

## 2017-12-11 DIAGNOSIS — R911 Solitary pulmonary nodule: Secondary | ICD-10-CM | POA: Diagnosis not present

## 2017-12-11 MED ORDER — UMECLIDINIUM-VILANTEROL 62.5-25 MCG/INH IN AEPB: 1.0000 | INHALATION_SPRAY | Freq: Every day | RESPIRATORY_TRACT | 6 refills | Status: DC

## 2017-12-11 NOTE — Assessment & Plan Note (Signed)
No SS Has declined further imaging and bronch in the past Plan: CT Chest without contrast We will call with results

## 2017-12-11 NOTE — Progress Notes (Signed)
History of Present Illness Nichole Lawrence is a 84 y.o. female history of passive smoke exposure, with Pulmonary Emphysema & Multiple Lung Nodules. She is a previous patient of Dr. Ashok Cordia.  HPI Pulmonary Emphysema: No evidence of alpha-1 antitrypsin deficiency. Patient has no significant prior history of tobacco smoke exposure. Does have history of secondhand smoke exposure. Significant bronchodilator response on spirometry today but no evidence of airway obstruction. She still has dyspnea, mainly in the morning, especially with dressing. Wheezing is mild and mostly first thing in the morning.She has pulmonary nodules that we are following with imaging annually.  Multiple Lung Nodules: Patient appears to have new 6 mm left lower lobe nodule. Other areas of bronchiectasis and tree-in-bud opacities consistent with possible atypical infectious process. She reports a nonproductive cough.    12/11/2017 6 month follow up:  Pt. Presents for follow up. She is a previous patient of Dr. Ashok Cordia. Plan after last visit was as follows:  1. Pulmonary emphysema:Trying patient on Anoro. She will contact my office if she has any questions or concerns. 2. Multiple lung nodules:  Likely atypical infection. Patient declines further imaging. Deferring bronchoscopy. 3. Health maintenance: Status post unspecified pneumonia vaccine January 2014. She reports she had Flu Vaccine 2-3 weeks ago.  4. Follow-up: Return to clinic in 6 months or sooner if needed.  Pt. States she has been using Anoro. She states her dyspnea is resolved since starting Anoro. She states that the Anoro has worked wonders. She states she has no secretions. No wheezing , no flares. She has no further shortness of breath with exertion.She is compliant with her medication daily.She needs a renewal of her prescription. She needs CT Chest as follow up imaging for her pulmonary nodules.She denies any weight loss, hemoptysis or chest pain. She denies  cough.    Test Results: PFT 06/12/17: FVC 2.13 L (74%) FEV1 1.76 L (82%) FEV1/FVC 0.83 FEF 25-75 1.70 L (118%) positive bronchodilator response TLC 5.67 L (103%) RV 134% ERV 80% DLCO corrected 53% 07/30/15: FVC 2.68 L (90%) FEV1 1.94 L (88%) FEV1/FVC 0.72 FEF 25-75 1.32 L (85%) negative bronchodilator response TLC 6.49 L (117%) RV 149% ERV 117% DLCO uncorrected 54%  6MWT 06/12/17:  Walked 432 meters / Baseline Sat 98% on RA / Nadir Sat 97% on RA  IMAGING CT CHEST W/O 05/12/17   New 6 mm nodule in left lower lobe. Diffuse bronchial wall thickening with findings consistent with bronchiectasis and right middle and upper lobes bilaterally. Tree-in-bud opacities predominantly within right upper and right middle lobes. No pleural effusion or thickening. No pericardial effusion. No pathologic mediastinal adenopathy.  CT CHEST W/ CONTRAST 07/01/15   Checking but nodularity noted in bilateral lungs and multiple lobes upper, middle, lingula, and lower. No pathologic mediastinal adenopathy. Largest nodule measures 1 cm in right upper lobe with some spiculation. No pleural effusion or thickening. No pericardial effusion. very mild apical predominant emphysema. Mild pectus excavatum.   CARDIAC TTE (07/25/14):  LV normal in size with EF 55-60%. Normal wall thickness. LA mildly dilated & RA normal in size. RV normal in size and function. Aortic valve poorly visualized with mild regurgitation. Aortic root normal in size. Trivial mitral regurgitation. Trivial pulmonic regurgitation. Mild tricuspid regurgitation.   LABS 05/11/17 Alpha-1 antitrypsin: MM (154) ESR: 3 CRP: 0.3 ANA:  1:160 RA:  <14 Anti-CCP:  <16   CBC Latest Ref Rng & Units 03/19/2016  WBC 4.0 - 10.5 K/uL 7.1  Hemoglobin 12.0 - 15.0 g/dL 13.3  Hematocrit 36.0 - 46.0 % 40.5  Platelets 150 - 400 K/uL 191    BMP Latest Ref Rng & Units 03/19/2016 06/22/2015  Glucose 65 - 99 mg/dL 99 88  BUN 6 - 20 mg/dL 8 12  Creatinine 0.44 - 1.00 mg/dL  0.68 0.72  Sodium 135 - 145 mmol/L 138 139  Potassium 3.5 - 5.1 mmol/L 3.9 4.2  Chloride 101 - 111 mmol/L 106 103  CO2 22 - 32 mmol/L 26 27  Calcium 8.9 - 10.3 mg/dL 9.0 9.6    BNP No results found for: BNP  ProBNP No results found for: PROBNP  PFT    Component Value Date/Time   FEV1PRE 1.76 06/12/2017 1414   FEV1POST 1.86 06/12/2017 1414   FVCPRE 2.13 06/12/2017 1414   FVCPOST 2.38 06/12/2017 1414   TLC 5.67 06/12/2017 1414   DLCOUNC 15.00 06/12/2017 1414   PREFEV1FVCRT 83 06/12/2017 1414   PSTFEV1FVCRT 78 06/12/2017 1414    No results found.   Past medical hx Past Medical History:  Diagnosis Date  . Breast cancer ([place])   . SVT (supraventricular tachycardia) (H[facility])      Social History   Tobacco Use  . Smoking status: Passive Smoke Exposure - Never Smoker  . Smokeless tobacco: Never Used  . Tobacco comment: Husband and Father   Substance Use Topics  . Alcohol use: Yes    Alcohol/week: 0.0 oz    Comment: Occasional  . Drug use: No    Ms.Steadman reports that she is a non-smoker but has been exposed to tobacco smoke. She has never used smokeless tobacco. She reports that she drinks alcohol. She reports that she does not use drugs.  Tobacco Cessation: Passive smoke exposure  Past surgical hx, Family hx, Social hx all reviewed.  Current Outpatient Medications on File Prior to Visit  Medication Sig  . aspirin 81 MG tablet Take 81 mg by mouth daily.  Marland Kitchen CARTIA XT 180 MG 24 hr capsule TAKE ONE CAPSULE BY MOUTH ONCE DAILY  . MAGNESIUM SULFATE PO Take 250 mg by mouth daily.  . Multiple Vitamin (MULTIVITAMIN) capsule Take 1 capsule by mouth daily.   No current facility-administered medications on file prior to visit.      Allergies  Allergen Reactions  . Demerol [Meperidine] Nausea And Vomiting  . Nsaids Rash    Review Of Systems:  Constitutional:   No  weight loss, night sweats,  Fevers, chills, fatigue, or  lassitude.  HEENT:   No headaches,   Difficulty swallowing,  Tooth/dental problems, or  Sore throat,                No sneezing, itching, ear ache, nasal congestion, post nasal drip,   CV:  No chest pain,  Orthopnea, PND, swelling in lower extremities, anasarca, dizziness, palpitations, syncope.   GI  No heartburn, indigestion, abdominal pain, nausea, vomiting, diarrhea, change in bowel habits, loss of appetite, bloody stools.   Resp: No shortness of breath with exertion or at rest.  Scant  excess mucus, no productive cough,  No non-productive cough,  No coughing up of blood.  No change in color of mucus.  No wheezing.  No chest wall deformity  Skin: no rash or lesions.  GU: no dysuria, change in color of urine, no urgency or frequency.  No flank pain, no hematuria   MS:  No joint pain or swelling.  No decreased range of motion.  No back pain.  Psych:  No change in mood or affect. No  depression or anxiety.  No memory loss.   Vital Signs BP 138/80 (BP Location: Left Arm, Cuff Size: Normal)   Pulse 86   Ht _0  (1.702 m)   Wt 132 lb 9.6 oz (60.1 kg)   SpO2 98%   BMI 20.77 kg/m    Physical Exam:  General- No distress,  A&Ox3, pleasant ENT: No sinus tenderness, TM clear, pale nasal mucosa, no oral exudate,no post nasal drip, no LAN Cardiac: S1, S2, regular rate and rhythm, no murmur Chest: No wheeze/ rales/ dullness; no accessory muscle use, no nasal flaring, no sternal retractions Abd.: Soft Non-tender, non-distended, BS + Ext: No clubbing cyanosis, edema Neuro:  normal strength, MAE x 4, A&O x 3 Skin: No rashes, warm and dry Psych: normal mood and behavior   Assessment/Plan  Other emphysema (HCC) Compliant with Anoro Dyspnea is much better Plan: We will schedule a CT Chest w/o contrast>> Nodule follow up. We will call you with results. We will send in a prescription for Anoro. 1 puff once daily. Rinse mouth after use. We will reassign you to Dr. Halford Chessman Follow up in 6 months with Dr. Halford Chessman or Judson Roch  NP. Please contact office for sooner follow up if symptoms do not improve or worsen or seek emergency care  Note your daily symptoms > remember "red flags" for COPD:  Increase in cough, increase in sputum production, increase in shortness of breath or activity tolerance. If you notice these symptoms, please call to be seen.      Pulmonary nodule No SS Has declined further imaging and bronch in the past Plan: CT Chest without contrast We will call with results  Needs to be assigned to a new doc  Magdalen Spatz, NP 12/11/2017  9:48 AM

## 2017-12-11 NOTE — Assessment & Plan Note (Signed)
Compliant with Anoro Dyspnea is much better Plan: We will schedule a CT Chest w/o contrast>> Nodule follow up. We will call you with results. We will send in a prescription for Anoro. 1 puff once daily. Rinse mouth after use. We will reassign you to Dr. Halford Chessman Follow up in 6 months with Dr. Halford Chessman or Judson Roch NP. Please contact office for sooner follow up if symptoms do not improve or worsen or seek emergency care  Note your daily symptoms > remember "red flags" for COPD:  Increase in cough, increase in sputum production, increase in shortness of breath or activity tolerance. If you notice these symptoms, please call to be seen.

## 2017-12-11 NOTE — Patient Instructions (Addendum)
It is nice to meet you today. We will schedule a CT Chest w/o contrast>> Nodule follow up. We will call you with results. We will send in a prescription for Anoro. 1 puff once daily. Rinse mouth after use. We will reassign you to Dr. Halford Chessman Follow up in 6 months with Dr. Halford Chessman or Judson Roch NP. Please contact office for sooner follow up if symptoms do not improve or worsen or seek emergency care  Note your daily symptoms > remember "red flags" for COPD:  Increase in cough, increase in sputum production, increase in shortness of breath or activity tolerance. If you notice these symptoms, please call to be seen.

## 2017-12-19 ENCOUNTER — Ambulatory Visit (INDEPENDENT_AMBULATORY_CARE_PROVIDER_SITE_OTHER)
Admission: RE | Admit: 2017-12-19 | Discharge: 2017-12-19 | Disposition: A | Discharge status: Home / Self Care | Payer: Medicare Other | Source: Ambulatory Visit | Attending: Acute Care | Admitting: Acute Care

## 2017-12-19 ENCOUNTER — Encounter: Payer: Self-pay | Admitting: Adult Health

## 2017-12-19 DIAGNOSIS — R918 Other nonspecific abnormal finding of lung field: Secondary | ICD-10-CM | POA: Diagnosis not present

## 2017-12-19 DIAGNOSIS — R911 Solitary pulmonary nodule: Secondary | ICD-10-CM

## 2017-12-19 NOTE — Telephone Encounter (Signed)
Patient saw Judson Roch last on 4.29.19 Pt has upcoming appt with TP on 5.8.19 Would recommend sending e-mail to Judson Roch since she saw the patient last Thank you

## 2017-12-19 NOTE — Telephone Encounter (Signed)
SG please advise on this, thanks.

## 2017-12-20 ENCOUNTER — Ambulatory Visit: Payer: Medicare Other | Admitting: Adult Health

## 2017-12-20 ENCOUNTER — Encounter: Payer: Self-pay | Admitting: Adult Health

## 2017-12-20 VITALS — BP 100/60 | HR 89 | Temp 98.2°F | Ht 66.0 in | Wt 136.0 lb

## 2017-12-20 DIAGNOSIS — J471 Bronchiectasis with (acute) exacerbation: Secondary | ICD-10-CM

## 2017-12-20 DIAGNOSIS — R911 Solitary pulmonary nodule: Secondary | ICD-10-CM

## 2017-12-20 DIAGNOSIS — R918 Other nonspecific abnormal finding of lung field: Secondary | ICD-10-CM

## 2017-12-20 DIAGNOSIS — R05 Cough: Secondary | ICD-10-CM | POA: Diagnosis not present

## 2017-12-20 DIAGNOSIS — J189 Pneumonia, unspecified organism: Secondary | ICD-10-CM

## 2017-12-20 DIAGNOSIS — R6889 Other general symptoms and signs: Secondary | ICD-10-CM

## 2017-12-20 LAB — INFLUENZA A AND B
Influenza A, POC: NEGATIVE
Influenza B, POC: NEGATIVE

## 2017-12-20 MED ORDER — LEVOFLOXACIN 500 MG PO TABS: 500.0000 mg | ORAL_TABLET | Freq: Every day | ORAL | 0 refills | Status: DC

## 2017-12-20 MED ORDER — FLUTTER DEVI: 0 refills | Status: AC

## 2017-12-20 NOTE — Patient Instructions (Signed)
Begin Levaquin 500mg  daily for 1 week , take with food.  Mucinex Twice daily As needed  Cough/congestion  Rest and fluids  Tylenol As needed  Fever.  Flutter valve Three times a day  For cough /congestion  Follow up in 1 week with chest xray and As needed   Please contact office for sooner follow up if symptoms do not improve or worsen or seek emergency care

## 2017-12-20 NOTE — Progress Notes (Signed)
@Patient  ID: Nichole Lawrence, female    DOB: June 14, 1933, 84 y.o.   MRN: [id number]  Chief Complaint  Patient presents with  . Acute Visit    cough     Referring provider: Merrilee Seashore, MD  HPI: 84 year old female  never smoker (passive smoke exposure) followed for multiple lung nodules, bronchiectasis    12/20/17  Acute OV : Cough  Patient presents for an acute office visit.  Patient complains over the last Patient complains over the last 3 days that she has had increased cough, congestion with thick mucus fever T-max 102 and malaise.  She is taken some over-the-counter cold medicines without much relief.  She is taken Tylenol for fever. Appetite is down but she is eating and drinking.  Denies any confusion, hemoptysis, chest pain, shortness of breath, or increased edema. Patient had CT chest on Dec 19, 2017 to follow known multiple lung nodules.  This showed interval worsening of her tree-in-bud opacities, patchy consolidation and groundglass attenuation with nodularity in the areas of bronchiectasis most prominent in the mid to lower right lung.  Left lower lobe nodule remains stable at 6 mm. Influenza swab today is negative  Allergies  Allergen Reactions  . Demerol [Meperidine] Nausea And Vomiting  . Nsaids Rash    Immunization History  Administered Date(s) Administered  . Influenza-Unspecified 05/11/2015  . Pneumococcal-Unspecified 08/15/2012    Past Medical History:  Diagnosis Date  . Breast cancer ([place])   . SVT (supraventricular tachycardia) (HCC)     Tobacco History: Social History   Tobacco Use  Smoking Status Passive Smoke Exposure - Never Smoker  Smokeless Tobacco Never Used  Tobacco Comment   Husband and Father    Counseling given: Not Answered Comment: Husband and Father    Outpatient Encounter Medications as of 12/20/2017  Medication Sig  . aspirin 81 MG tablet Take 81 mg by mouth daily.  Marland Kitchen CARTIA XT 180 MG 24 hr capsule TAKE ONE CAPSULE BY  MOUTH ONCE DAILY  . MAGNESIUM SULFATE PO Take 250 mg by mouth daily.  . Multiple Vitamin (MULTIVITAMIN) capsule Take 1 capsule by mouth daily.  Marland Kitchen umeclidinium-vilanterol (ANORO ELLIPTA) 62.5-25 MCG/INH AEPB Inhale 1 puff into the lungs daily.  Marland Kitchen levofloxacin (LEVAQUIN) 500 MG tablet Take 1 tablet (500 mg total) by mouth daily.  Marland Kitchen Respiratory Therapy Supplies (FLUTTER) DEVI Three times a day for cough and congestion   No facility-administered encounter medications on file as of 12/20/2017.      Review of Systems  Constitutional:   No  weight loss, night sweats,   +Fevers, chills, fatigue, or  lassitude.  HEENT:   No headaches,  Difficulty swallowing,  Tooth/dental problems, or  Sore throat,                No sneezing, itching, ear ache,  +nasal congestion, post nasal drip,   CV:  No chest pain,  Orthopnea, PND, swelling in lower extremities, anasarca, dizziness, palpitations, syncope.   GI  No heartburn, indigestion, abdominal pain, nausea, vomiting, diarrhea, change in bowel habits, loss of appetite, bloody stools.   Resp:   No chest wall deformity  Skin: no rash or lesions.  GU: no dysuria, change in color of urine, no urgency or frequency.  No flank pain, no hematuria   MS:  No joint pain or swelling.  No decreased range of motion.  No back pain.    Physical Exam  BP 100/60 (BP Location: Left Arm, Cuff Size: Normal)  Pulse 89   Temp 98.2 F (36.8 C) (Oral)   Ht 5\' 6"  (1.676 m)   Wt 136 lb (61.7 kg)   SpO2 91%   BMI 21.95 kg/m   GEN: A/Ox3; pleasant , NAD, elderly    HEENT:  [place]/AT,  EACs-clear, TMs-wnl, NOSE-clear, THROAT-clear, no lesions, no postnasal drip or exudate noted.   NECK:  Supple w/ fair ROM; no JVD; normal carotid impulses w/o bruits; no thyromegaly or nodules palpated; no lymphadenopathy.    RESP a few trace rhonchi  no accessory muscle use, no dullness to percussion  CARD:  RRR, no m/r/g, no peripheral edema, pulses intact, no cyanosis or  clubbing.  GI:   Soft & nt; nml bowel sounds; no organomegaly or masses detected.   Musco: Warm bil, no deformities or joint swelling noted.   Neuro: alert, no focal deficits noted.    Skin: Warm, no lesions or rashes    Lab Results:  CBC  BMET  BNP No results found for: BNP  ProBNP No results found for: PROBNP  Imaging: Ct Chest Wo Contrast  Result Date: 12/19/2017 CLINICAL DATA:  Follow-up pulmonary nodule. History of breast cancer. EXAM: CT CHEST WITHOUT CONTRAST TECHNIQUE: Multidetector CT imaging of the chest was performed following the standard protocol without IV contrast. COMPARISON:  05/12/2017 chest CT. FINDINGS: Cardiovascular: Stable top-normal heart size. No significant pericardial fluid/thickening. Left anterior descending coronary atherosclerosis. Atherosclerotic nonaneurysmal thoracic aorta. Normal caliber pulmonary arteries. Mediastinum/Nodes: No discrete thyroid nodules. Unremarkable esophagus. No axillary adenopathy. Mild right paratracheal adenopathy up to 1.1 cm (series 2/image 55), stable. No new pathologically enlarged mediastinal lymph nodes. No discrete hilar adenopathy on these noncontrast images. Lungs/Pleura: No pneumothorax. No pleural effusion. There is scattered mild-to-moderate cylindrical and varicoid bronchiectasis throughout both lungs, most prominent in the dependent and basilar right upper lobe, right middle lobe, lingula and right lower lobe. There is interval worsening of prominent patchy tree-in-bud opacities, ground-glass opacities, ill-defined consolidation and centrilobular nodularity at the areas of bronchiectasis, particularly in the right middle and right lower lobes (for example in the basilar right lower lobe on series 3/image 111). Previously described 6 mm basilar left lower lobe pulmonary nodule is stable (series 3/image 110). Upper abdomen: Coarse right liver dome granulomatous calcification is stable. Musculoskeletal: No aggressive  appearing focal osseous lesions. Right mastectomy. Moderate thoracic spondylosis. IMPRESSION: 1. 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 Worsening tree-in-bud opacity, patchy consolidation, ground-glass attenuation and centrilobular nodularity at the areas of bronchiectasis, most prominent in the mid to lower right lung. Previously described 6 mm basilar left lower lobe pulmonary nodule is stable and probably inflammatory. High-resolution chest CT follow-up may be considered in 6 months as clinically warranted. 2. Mild right paratracheal adenopathy is stable and nonspecific, probably reactive. 3. One vessel coronary atherosclerosis. Aortic Atherosclerosis (ICD10-I70.0). Electronically Signed   By: Ilona Sorrel M.D.   On: 12/19/2017 15:37     Assessment & Plan:   No problem-specific Assessment & Plan notes found for this encounter.     Rexene Edison, NP

## 2017-12-21 DIAGNOSIS — J479 Bronchiectasis, uncomplicated: Secondary | ICD-10-CM | POA: Insufficient documentation

## 2017-12-21 NOTE — Assessment & Plan Note (Signed)
Left lower lobe nodule stable follow-up CT chest in 1 year

## 2017-12-21 NOTE — Assessment & Plan Note (Signed)
Acute exacerbation with possible underlying pneumonia. Patient has had a bronchiectatic with possible MAI changes on CT dating back to 2016.  This does seem to be progressively worsened on CT this week. She also is acutely ill.  Will treat for acute infection.    she is unable to give a sputum sample today.  Going forward if she may need to check sputum for AFB  Plan  Patient Instructions  Begin Levaquin 500mg  daily for 1 week , take with food.  Mucinex Twice daily As needed  Cough/congestion  Rest and fluids  Tylenol As needed  Fever.  Flutter valve Three times a day  For cough /congestion  Follow up in 1 week with chest xray and As needed   Please contact office for sooner follow up if symptoms do not improve or worsen or seek emergency care

## 2018-01-02 ENCOUNTER — Ambulatory Visit: Payer: Medicare Other | Admitting: Adult Health

## 2018-01-03 ENCOUNTER — Encounter: Payer: Self-pay | Admitting: Adult Health

## 2018-01-03 ENCOUNTER — Ambulatory Visit: Payer: Medicare Other | Admitting: Adult Health

## 2018-01-03 ENCOUNTER — Ambulatory Visit (INDEPENDENT_AMBULATORY_CARE_PROVIDER_SITE_OTHER)
Admission: RE | Admit: 2018-01-03 | Discharge: 2018-01-03 | Disposition: A | Discharge status: Home / Self Care | Payer: Medicare Other | Source: Ambulatory Visit | Attending: Adult Health | Admitting: Adult Health

## 2018-01-03 VITALS — BP 124/78 | HR 79 | Ht 66.0 in | Wt 130.8 lb

## 2018-01-03 DIAGNOSIS — J471 Bronchiectasis with (acute) exacerbation: Secondary | ICD-10-CM

## 2018-01-03 DIAGNOSIS — J189 Pneumonia, unspecified organism: Secondary | ICD-10-CM

## 2018-01-03 DIAGNOSIS — R918 Other nonspecific abnormal finding of lung field: Secondary | ICD-10-CM

## 2018-01-03 DIAGNOSIS — R911 Solitary pulmonary nodule: Secondary | ICD-10-CM | POA: Diagnosis not present

## 2018-01-03 DIAGNOSIS — R0602 Shortness of breath: Secondary | ICD-10-CM | POA: Diagnosis not present

## 2018-01-03 NOTE — Patient Instructions (Addendum)
Mucinex Twice daily As needed  Cough/congestion  Flutter valve Three times a day  For cough /congestion  Follow up CT chest in 6 months   Follow up in 6 weeks with Dr. Vaughan Browner with chest xray and As needed   Please contact office for sooner follow up if symptoms do not improve or worsen or seek emergency care

## 2018-01-03 NOTE — Progress Notes (Signed)
_0  ID: Nichole Lawrence, female    DOB: 1932-10-07, 84 y.o.   MRN: [id number]  Chief Complaint  Patient presents with  . Follow-up    reports she is feelings much better, no concerns voiced.     Referring provider: Merrilee Seashore, MD  HPI: 84 year old never smoker followed for emphysema, bronchiectasis  and lung nodules  TEST  Test Results: PFT 06/12/17: FVC 2.13 L (74%) FEV1 1.76 L (82%) FEV1/FVC 0.83 FEF 25-75 1.70 L (118%) positive bronchodilator response TLC 5.67 L (103%) RV 134% ERV 80% DLCO corrected 53% 07/30/15: FVC 2.68 L (90%) FEV1 1.94 L (88%) FEV1/FVC 0.72 FEF 25-75 1.32 L (85%) negative bronchodilator response TLC 6.49 L (117%) RV 149% ERV 117% DLCO uncorrected 54% CT CHEST W/O 05/12/17 New 6 mm nodule in left lower lobe. Diffuse bronchial wall thickening with findings consistent with bronchiectasis and right middle and upper lobes bilaterally. Tree-in-bud opacities predominantly within right upper and right middle lobes. No pleural effusion or thickening. No pericardial effusion. No pathologic mediastinal adenopathy.  CT CHEST W/ CONTRAST 07/01/15  nodularity noted in bilateral lungs and multiple lobes upper, middle, lingula, and lower. No pathologic mediastinal adenopathy. Largest nodule measures 1 cm in right upper lobe with some spiculation. No pleural effusion or thickening. No pericardial effusion. very mild apical predominant emphysema. Mild pectus excavatum.   LABS 05/11/17 Alpha-1 antitrypsin: MM(154) ESR: 3 CRP: 0.3 ANA:1:160 RA: <14 Anti-CCP: <16    01/03/2018 Follow up : PNA /Bronchiectasis /?MAI  Patient presents for a 2-week follow-up.  She was seen last visit for an acute symptoms of cough congestion and fever.  CT chest that was being done to follow lung nodules showed interval worsening of patchy consolidation along the mid to lower right lung. She was treated for a suspected superimposed pneumonia.  Flu swab was negative.  She was  treated with Levaquin 500 mg daily.  Since last visit patient is feeling much better. Cough and congestion are decreased. Fever has resolved. Appetite is improved.  Chest x-ray today shows small area of residual infiltrate along the right lateral base..    Allergies  Allergen Reactions  . Demerol [Meperidine] Nausea And Vomiting  . Nsaids Rash    Immunization History  Administered Date(s) Administered  . Influenza, High Dose Seasonal PF 05/20/2017  . Influenza-Unspecified 05/11/2015  . Pneumococcal-Unspecified 08/15/2012    Past Medical History:  Diagnosis Date  . Breast cancer ([place])   . SVT (supraventricular tachycardia) ([facility])     Tobacco History: Social History   Tobacco Use  Smoking Status Passive Smoke Exposure - Never Smoker  Smokeless Tobacco Never Used  Tobacco Comment   Husband and Father    Counseling given: Not Answered Comment: Husband and Father    Outpatient Encounter Medications as of 01/03/2018  Medication Sig  . aspirin 81 MG tablet Take 81 mg by mouth daily.  Marland Kitchen CARTIA XT 180 MG 24 hr capsule TAKE ONE CAPSULE BY MOUTH ONCE DAILY  . MAGNESIUM SULFATE PO Take 250 mg by mouth daily.  . Multiple Vitamin (MULTIVITAMIN) capsule Take 1 capsule by mouth daily.  Marland Kitchen Respiratory Therapy Supplies (FLUTTER) DEVI Three times a day for cough and congestion  . umeclidinium-vilanterol (ANORO ELLIPTA) 62.5-25 MCG/INH AEPB Inhale 1 puff into the lungs daily.  Marland Kitchen levofloxacin (LEVAQUIN) 500 MG tablet Take 1 tablet (500 mg total) by mouth daily. (Patient not taking: Reported on 01/03/2018)   No facility-administered encounter medications on file as of 01/03/2018.  Review of Systems  Constitutional:   No  weight loss, night sweats,  Fevers, chills, fatigue, or  lassitude.  HEENT:   No headaches,  Difficulty swallowing,  Tooth/dental problems, or  Sore throat,                No sneezing, itching, ear ache, nasal congestion, post nasal drip,   CV:  No chest pain,   Orthopnea, PND, swelling in lower extremities, anasarca, dizziness, palpitations, syncope.   GI  No heartburn, indigestion, abdominal pain, nausea, vomiting, diarrhea, change in bowel habits, loss of appetite, bloody stools.   Resp: No shortness of breath with exertion or at rest.  No excess mucus, no productive cough,  No non-productive cough,  No coughing up of blood.  No change in color of mucus.  No wheezing.  No chest wall deformity  Skin: no rash or lesions.  GU: no dysuria, change in color of urine, no urgency or frequency.  No flank pain, no hematuria   MS:  No joint pain or swelling.  No decreased range of motion.  No back pain.    Physical Exam  BP 124/78 (BP Location: Left Arm, Cuff Size: Normal)   Pulse 79   Ht _0  (1.676 m)   Wt 130 lb 12.8 oz (59.3 kg)   SpO2 93%   BMI 21.11 kg/m   GEN: A/Ox3; pleasant , NAD, thin elderly female    HEENT:  [place]/AT,  EACs-clear, TMs-wnl, NOSE-clear, THROAT-clear, no lesions, no postnasal drip or exudate noted.   NECK:  Supple w/ fair ROM; no JVD; normal carotid impulses w/o bruits; no thyromegaly or nodules palpated; no lymphadenopathy.    RESP  Few trace rhonchi , i. no accessory muscle use, no dullness to percussion  CARD:  RRR, no m/r/g, no peripheral edema, pulses intact, no cyanosis or clubbing.  GI:   Soft & nt; nml bowel sounds; no organomegaly or masses detected.   Musco: Warm bil, no deformities or joint swelling noted.   Neuro: alert, no focal deficits noted.    Skin: Warm, no lesions or rashes    Lab Results:  CBC  BNP No results found for: BNP  ProBNP No results found for: PROBNP  Imaging: Dg Chest 2 View  Result Date: 01/03/2018 CLINICAL DATA:  Recent pneumonia.  Shortness of breath. EXAM: CHEST - 2 VIEW COMPARISON:  Dec 19, 2017 chest CT and June 19, 2015 chest radiograph FINDINGS: There is a small area of residual opacity in the lateral right base, felt to represent a small focus of residual  pneumonia. There are scattered areas of scarring bilaterally but no other focal areas felt to represent infiltrate. Bronchiectatic changes noted on the right, better seen on CT. Heart size is within normal limits. Pulmonary vascularity is normal. No adenopathy evident by radiography. There is aortic atherosclerosis. There is a degree of pectus excavatum. IMPRESSION: Small area of residual infiltrate lateral right base. Areas of scattered scarring bilaterally. There is bronchiectatic change on the right, better seen on recent CT. No new opacities are evident. Stable cardiac silhouette. There is a degree of pectus excavatum. There is aortic atherosclerosis. Aortic Atherosclerosis (ICD10-I70.0). Electronically Signed   By: Lowella Grip III M.D.   On: 01/03/2018 10:51   Ct Chest Wo Contrast  Result Date: 12/19/2017 CLINICAL DATA:  Follow-up pulmonary nodule. History of breast cancer. EXAM: CT CHEST WITHOUT CONTRAST TECHNIQUE: Multidetector CT imaging of the chest was performed following the standard protocol without IV contrast. COMPARISON:  05/12/2017 chest CT. FINDINGS: Cardiovascular: Stable top-normal heart size. No significant pericardial fluid/thickening. Left anterior descending coronary atherosclerosis. Atherosclerotic nonaneurysmal thoracic aorta. Normal caliber pulmonary arteries. Mediastinum/Nodes: No discrete thyroid nodules. Unremarkable esophagus. No axillary adenopathy. Mild right paratracheal adenopathy up to 1.1 cm (series 2/image 55), stable. No new pathologically enlarged mediastinal lymph nodes. No discrete hilar adenopathy on these noncontrast images. Lungs/Pleura: No pneumothorax. No pleural effusion. There is scattered mild-to-moderate cylindrical and varicoid bronchiectasis throughout both lungs, most prominent in the dependent and basilar right upper lobe, right middle lobe, lingula and right lower lobe. There is interval worsening of prominent patchy tree-in-bud opacities, ground-glass  opacities, ill-defined consolidation and centrilobular nodularity at the areas of bronchiectasis, particularly in the right middle and right lower lobes (for example in the basilar right lower lobe on series 3/image 111). Previously described 6 mm basilar left lower lobe pulmonary nodule is stable (series 3/image 110). Upper abdomen: Coarse right liver dome granulomatous calcification is stable. Musculoskeletal: No aggressive appearing focal osseous lesions. Right mastectomy. Moderate thoracic spondylosis. IMPRESSION: 1. 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 Worsening tree-in-bud opacity, patchy consolidation, ground-glass attenuation and centrilobular nodularity at the areas of bronchiectasis, most prominent in the mid to lower right lung. Previously described 6 mm basilar left lower lobe pulmonary nodule is stable and probably inflammatory. High-resolution chest CT follow-up may be considered in 6 months as clinically warranted. 2. Mild right paratracheal adenopathy is stable and nonspecific, probably reactive. 3. One vessel coronary atherosclerosis. Aortic Atherosclerosis (ICD10-I70.0). Electronically Signed   By: Ilona Sorrel M.D.   On: 12/19/2017 15:37     Assessment & Plan:   Pulmonary nodule Will need follow up CT chest in 6 months .   Bronchiectasis ([place]) Recent flare with associated superimposed PNA - clinically improving . CXR shows resolving infiltrate .  Suspect pt has underlying MAI  If contintues to have flare needs to see if able to check sputum cx /AFB or FOB for BAL   Plan  Patient Instructions  Mucinex Twice daily As needed  Cough/congestion  Flutter valve Three times a day  For cough /congestion  Follow up CT chest in 6 months   Follow up in 6 weeks with Dr. Vaughan Browner with chest xray and As needed   Please contact office for sooner follow up if symptoms do not improve  or worsen or seek emergency care          Rexene Edison, NP 01/03/2018

## 2018-01-03 NOTE — Addendum Note (Signed)
Addended by: Parke Poisson E on: 01/03/2018 12:45 PM   Modules accepted: Orders

## 2018-01-03 NOTE — Assessment & Plan Note (Signed)
Recent flare with associated superimposed PNA - clinically improving . CXR shows resolving infiltrate .  Suspect pt has underlying MAI  If contintues to have flare needs to see if able to check sputum cx /AFB or FOB for BAL   Plan  Patient Instructions  Mucinex Twice daily As needed  Cough/congestion  Flutter valve Three times a day  For cough /congestion  Follow up CT chest in 6 months   Follow up in 6 weeks with Dr. Vaughan Browner with chest xray and As needed   Please contact office for sooner follow up if symptoms do not improve or worsen or seek emergency care

## 2018-01-03 NOTE — Assessment & Plan Note (Signed)
Will need follow up CT chest in 6 months .

## 2018-01-13 ENCOUNTER — Encounter: Payer: Self-pay | Admitting: Adult Health

## 2018-01-15 NOTE — Telephone Encounter (Signed)
Spoke with Nichole Lawrence over the phone. I offered her an appointment with Dr. Vaughan Browner on 01/16/18. Nichole Lawrence refused appointment. Stated that she was starting to feel better. Advised her to call us if she starts feeling worse. Nothing further was needed.

## 2018-01-15 NOTE — Telephone Encounter (Signed)
Received email from patient, copied below.  TP please advise. Thank you!  ----- Message -----  From: Shayne Alken  Sent: 6/1/20196:53 PM EDT  To: Rexene Edison, NP Subject: Visit Follow-Up Question  Hello, Since I last saw you and coming off the Levaquin my progress has been going backwards. Shortness of breath and fatigue has returned.Do I need another antibiotic to get rid of the pneumonia? Thank you.

## 2018-01-15 NOTE — Telephone Encounter (Signed)
Per TP: so sorry to hear that she is still not feeling well.  Due to her persistent symptoms would recommend that she come in for an appt with Dr Vaughan Browner (no APP).  Thank you

## 2018-01-16 ENCOUNTER — Ambulatory Visit: Payer: Medicare Other | Admitting: Pulmonary Disease

## 2018-01-17 DIAGNOSIS — H43812 Vitreous degeneration, left eye: Secondary | ICD-10-CM | POA: Diagnosis not present

## 2018-02-19 ENCOUNTER — Telehealth: Payer: Self-pay

## 2018-02-19 ENCOUNTER — Ambulatory Visit: Payer: Medicare Other | Admitting: Pulmonary Disease

## 2018-02-19 ENCOUNTER — Ambulatory Visit (INDEPENDENT_AMBULATORY_CARE_PROVIDER_SITE_OTHER)
Admission: RE | Admit: 2018-02-19 | Discharge: 2018-02-19 | Disposition: A | Discharge status: Home / Self Care | Payer: Medicare Other | Source: Ambulatory Visit | Attending: Adult Health | Admitting: Adult Health

## 2018-02-19 ENCOUNTER — Encounter: Payer: Self-pay | Admitting: Pulmonary Disease

## 2018-02-19 VITALS — BP 120/76 | HR 92 | Ht 66.0 in | Wt 129.4 lb

## 2018-02-19 DIAGNOSIS — R911 Solitary pulmonary nodule: Secondary | ICD-10-CM | POA: Diagnosis not present

## 2018-02-19 DIAGNOSIS — J439 Emphysema, unspecified: Secondary | ICD-10-CM | POA: Diagnosis not present

## 2018-02-19 DIAGNOSIS — J479 Bronchiectasis, uncomplicated: Secondary | ICD-10-CM | POA: Diagnosis not present

## 2018-02-19 DIAGNOSIS — J189 Pneumonia, unspecified organism: Secondary | ICD-10-CM | POA: Diagnosis not present

## 2018-02-19 NOTE — Patient Instructions (Addendum)
I am glad you are doing better with your breathing Continue Anoro inhaler as per instructions Your chest x-ray today shows improvement in lung opacities which is good news We will order a CT chest high-resolution CT in May 2020 for follow-up of bronchiectasis and lung nodule Follow-up in clinic in 6 months.  Please call sooner if there is any change in your symptoms.

## 2018-02-19 NOTE — Progress Notes (Signed)
Inhaler training given. In-check peak flow #:60

## 2018-02-19 NOTE — Progress Notes (Signed)
Nichole Lawrence    [id number]    10/11/1932  Primary Care Physician:Ramachandran, Mauro Kaufmann, MD  Referring Physician: Merrilee Seashore, [place] [address] [place], [place] [address]  Chief complaint: Follow-up for emphysema, bronchiectasis, lung nodules.  HPI: 84 year old with history of pulmonary emphysema, bronchiectasis, pulmonary nodules.  Previously followed by Dr. Ashok Cordia She is a non-smoker with exposure to secondhand smoke.  No evidence of alpha-1 antitrypsin deficiency.  Seen in pulmonary clinic in May 2019 with pneumonia, bronchiectasis exacerbation.  This was treated with antibiotics CT at that time showed worsening pulmonary opacities  She returns to clinic today for follow-up.  Reports that breathing is improved since last visit.  She still has some cough, nonproductive in nature.  No fevers, chills She is currently on anoro and feels that is helping a lot with her breathing.  Pets: Dog, no cats, birds. Occupation: Used to work as an Therapist, sports in ED, Marine scientist and nursery. Exposures: No known exposures, no mold, hot tub, Jacuzzi Smoking history: Never smoker exposed to secondhand smoke  Travel history: Originally from [place]. Relevant family history: No significant family history of lung issues.  Outpatient Encounter Medications as of 02/19/2018  Medication Sig  . aspirin 81 MG tablet Take 81 mg by mouth daily.  Marland Kitchen CARTIA XT 180 MG 24 hr capsule TAKE ONE CAPSULE BY MOUTH ONCE DAILY  . MAGNESIUM SULFATE PO Take 250 mg by mouth daily.  . Multiple Vitamin (MULTIVITAMIN) capsule Take 1 capsule by mouth daily.  Marland Kitchen Respiratory Therapy Supplies (FLUTTER) DEVI Three times a day for cough and congestion  . umeclidinium-vilanterol (ANORO ELLIPTA) 62.5-25 MCG/INH AEPB Inhale 1 puff into the lungs daily.  . [DISCONTINUED] levofloxacin (LEVAQUIN) 500 MG tablet Take 1 tablet (500 mg total) by mouth daily.   No facility-administered encounter medications on file as of  02/19/2018.     Allergies as of 02/19/2018 - Review Complete 02/19/2018  Allergen Reaction Noted  . Demerol [meperidine] Nausea And Vomiting 08/28/2014  . Nsaids Rash 08/28/2014    Past Medical History:  Diagnosis Date  . Breast cancer ([place])   . SVT (supraventricular tachycardia) ([facility])     Past Surgical History:  Procedure Laterality Date  . BACK SURGERY    . MASTECTOMY Right   . TONSILLECTOMY    . TUBAL LIGATION      Family History  Problem Relation Age of Onset  . Heart disease Mother   . Emphysema Mother        never smoked  . Rheum arthritis Mother   . Breast cancer Mother   . Lung cancer Father   . CAD Brother     Social History   Socioeconomic History  . Marital status: Widowed    Spouse name: Not on file  . Number of children: 2  . Years of education: Not on file  . Highest education level: Not on file  Occupational History  . Occupation: Retired Animal nutritionist  . Financial resource strain: Not on file  . Food insecurity:    Worry: Not on file    Inability: Not on file  . Transportation needs:    Medical: Not on file    Non-medical: Not on file  Tobacco Use  . Smoking status: Passive Smoke Exposure - Never Smoker  . Smokeless tobacco: Never Used  . Tobacco comment: Husband and Father   Substance and Sexual Activity  . Alcohol use: Yes    Alcohol/week: 0.0 oz  Comment: Occasional  . Drug use: No  . Sexual activity: Not on file  Lifestyle  . Physical activity:    Days per week: Not on file    Minutes per session: Not on file  . Stress: Not on file  Relationships  . Social connections:    Talks on phone: Not on file    Gets together: Not on file    Attends religious service: Not on file    Active member of club or organization: Not on file    Attends meetings of clubs or organizations: Not on file    Relationship status: Not on file  . Intimate partner violence:    Fear of current or ex partner: Not on file    Emotionally abused: Not  on file    Physically abused: Not on file    Forced sexual activity: Not on file  Other Topics Concern  . Not on file  Social History Narrative    Pulmonary (05/11/17):   Originally from [place]. [place]. Moved to [place] in 2006. Primarily worked as an Therapist, sports in the E.D., PACU, and in nursery. No known TB exposure. No mold, bird, or hot tub exposure. Remotely had a swimming pool. Enjoys exercising and walking. She does have a dog and enjoys gardening.     Review of systems: Review of Systems  Constitutional: Negative for fever and chills.  HENT: Negative.   Eyes: Negative for blurred vision.  Respiratory: as per HPI  Cardiovascular: Negative for chest pain and palpitations.  Gastrointestinal: Negative for vomiting, diarrhea, blood per rectum. Genitourinary: Negative for dysuria, urgency, frequency and hematuria.  Musculoskeletal: Negative for myalgias, back pain and joint pain.  Skin: Negative for itching and rash.  Neurological: Negative for dizziness, tremors, focal weakness, seizures and loss of consciousness.  Endo/Heme/Allergies: Negative for environmental allergies.  Psychiatric/Behavioral: Negative for depression, suicidal ideas and hallucinations.  All other systems reviewed and are negative.  Physical Exam: Blood pressure 120/76, pulse 92, height '5\' 6"'  (1.676 m), weight 129 lb 6.4 oz (58.7 kg), SpO2 93 %. Gen:      No acute distress HEENT:  EOMI, sclera anicteric Neck:     No masses; no thyromegaly Lungs:    Clear to auscultation bilaterally; normal respiratory effort CV:         Regular rate and rhythm; no murmurs Abd:      + bowel sounds; soft, non-tender; no palpable masses, no distension Ext:    No edema; adequate peripheral perfusion Skin:      Warm and dry; no rash Neuro: alert and oriented x 3 Psych: normal mood and affect  Data Reviewed: PFT 06/12/17: FVC 2.13 L (74%) FEV1 1.76 L (82%) FEV1/FVC 0.83 FEF 25-75 1.70 L (118%) positive bronchodilator  response TLC 5.67 L (103%) RV 134% ERV 80% DLCO corrected 53% 07/30/15: FVC 2.68 L (90%) FEV1 1.94 L (88%) FEV1/FVC 0.72 FEF 25-75 1.32 L (85%) negative bronchodilator response TLC 6.49 L (117%) RV 149% ERV 117% DLCO uncorrected 54%  CT CHEST W/ CONTRAST 07/01/15 nodularity noted in bilateral lungs and multiple lobes upper, middle, lingula, and lower. No pathologic mediastinal adenopathy. Largest nodule measures 1 cm in right upper lobe with some spiculation. No pleural effusion or thickening. No pericardial effusion. very mild apical predominant emphysema. Mild pectus excavatum.   CT CHEST W/O 05/12/17 New 6 mm nodule in left lower lobe. Diffuse bronchial wall thickening with findings consistent with bronchiectasis and right middle and upper lobes bilaterally. Tree-in-bud  opacities predominantly within right upper and right middle lobes. No pleural effusion or thickening. No pericardial effusion. No pathologic mediastinal adenopathy.  CT chest without contrast 12/19/2017- 17 but opacity, patchy consolidation, groundglass attenuation, nodularity, bronchiectasis.  Stable 6 mm left lower lobe nodule.  Chest x-ray 02/19/2018- improvement in patchy bilateral infiltrates.  Emphysema. I have reviewed the images personally.  LABS 05/11/17 Alpha-1 antitrypsin: MM(154) ESR: 3 CRP: 0.3 ANA:1:160, speckled RA: <14 Anti-CCP: <16  Assessment:  Pulmonary emphysema She has a good response to anoro.  Will continue the same   Bronchiectasis, recent exacerbation with pneumonia Symptoms have improved after treatment with Levaquin. Chest x-ray from today reviewed with improved pulmonary opacities.  Continue follow-up with CT scan in 2020 She currently is not making any sputum hence not be tested for cultures to rule out MAI She has declined bronchoscopy in the past.  We will continue to monitor her symptoms for any recurrence  Pulmonary nodule Stable on follow-up CT scan.  Can be reviewed on  follow-up scan in 2020  Health maintenance 05/20/2017-influenza 08/15/2012-unspecified pneumococcal vaccine. We will clarify immunization status with primary care.  Plan/Recommendations: - Continue Anoro - Follow-up CT in May 2020  [name] MD [facility] 02/19/2018, 9:02 AM  CC: Merrilee Seashore, MD

## 2018-02-19 NOTE — Telephone Encounter (Signed)
Contacted PCP to obtain vaccine dates.  Advised that nurse will call back.  Will await call back

## 2018-02-21 NOTE — Telephone Encounter (Signed)
PNA vaccines have been updated within pt's chart per PCP.  Nothing further is needed.

## 2018-04-03 DIAGNOSIS — Z1231 Encounter for screening mammogram for malignant neoplasm of breast: Secondary | ICD-10-CM | POA: Diagnosis not present

## 2018-04-03 DIAGNOSIS — Z853 Personal history of malignant neoplasm of breast: Secondary | ICD-10-CM | POA: Diagnosis not present

## 2018-05-11 ENCOUNTER — Other Ambulatory Visit: Payer: Self-pay | Admitting: Cardiology

## 2018-05-11 NOTE — Telephone Encounter (Signed)
Rx request sent to pharmacy.  

## 2018-06-19 NOTE — Progress Notes (Signed)
HPI: FU chest painand SVT. Echocardiogram December 2015 showed normal LV function, mild aortic insufficiency and mild left atrial enlargement. Nuclear study August 2018 showed normal perfusion and ejection fraction 72%. Pulmonary function tests October 2018 showed moderately decreased DLCO and restrictive disease. Chest CT September 2018 showed bronchiectasis and pulmonary nodule in the left base. Follow-up recommended 6-12 months. LAD atherosclerosis noted. Now followed by pulmonary.  Since last seenshe denies dyspnea, chest pain or syncope.  Occasional brief flutters but not sustained.  Current Outpatient Medications  Medication Sig Dispense Refill  . aspirin 81 MG tablet Take 81 mg by mouth daily.    Marland Kitchen CARTIA XT 180 MG 24 hr capsule TAKE 1 CAPSULE BY MOUTH ONCE DAILY 30 capsule 2  . MAGNESIUM SULFATE PO Take 250 mg by mouth daily.    . Multiple Vitamin (MULTIVITAMIN) capsule Take 1 capsule by mouth daily.    Marland Kitchen umeclidinium-vilanterol (ANORO ELLIPTA) 62.5-25 MCG/INH AEPB Inhale 1 puff into the lungs daily. 1 each 6  . Respiratory Therapy Supplies (FLUTTER) DEVI Three times a day for cough and congestion (Patient not taking: Reported on 06/29/2018) 1 each 0   No current facility-administered medications for this visit.      Past Medical History:  Diagnosis Date  . Breast cancer ([place])   . SVT (supraventricular tachycardia) ([facility])     Past Surgical History:  Procedure Laterality Date  . BACK SURGERY    . MASTECTOMY Right   . TONSILLECTOMY    . TUBAL LIGATION      Social History   Socioeconomic History  . Marital status: Widowed    Spouse name: Not on file  . Number of children: 2  . Years of education: Not on file  . Highest education level: Not on file  Occupational History  . Occupation: Retired Animal nutritionist  . Financial resource strain: Not on file  . Food insecurity:    Worry: Not on file    Inability: Not on file  . Transportation needs:    Medical: Not  on file    Non-medical: Not on file  Tobacco Use  . Smoking status: Passive Smoke Exposure - Never Smoker  . Smokeless tobacco: Never Used  . Tobacco comment: Husband and Father   Substance and Sexual Activity  . Alcohol use: Yes    Alcohol/week: 0.0 standard drinks    Comment: Occasional  . Drug use: No  . Sexual activity: Not on file  Lifestyle  . Physical activity:    Days per week: Not on file    Minutes per session: Not on file  . Stress: Not on file  Relationships  . Social connections:    Talks on phone: Not on file    Gets together: Not on file    Attends religious service: Not on file    Active member of club or organization: Not on file    Attends meetings of clubs or organizations: Not on file    Relationship status: Not on file  . Intimate partner violence:    Fear of current or ex partner: Not on file    Emotionally abused: Not on file    Physically abused: Not on file    Forced sexual activity: Not on file  Other Topics Concern  . Not on file  Social History Narrative   [facility] (05/11/17):   Originally from [place]. [place]. Moved to [place] in 2006. Primarily worked as an Therapist, sports in  the E.D., PACU, and in nursery. No known TB exposure. No mold, bird, or hot tub exposure. Remotely had a swimming pool. Enjoys exercising and walking. She does have a dog and enjoys gardening.     Family History  Problem Relation Age of Onset  . Heart disease Mother   . Emphysema Mother        never smoked  . Rheum arthritis Mother   . Breast cancer Mother   . Lung cancer Father   . CAD Brother     ROS: no fevers or chills, productive cough, hemoptysis, dysphasia, odynophagia, melena, hematochezia, dysuria, hematuria, rash, seizure activity, orthopnea, PND, pedal edema, claudication. Remaining systems are negative.  Physical Exam: Well-developed well-nourished in no acute distress.  Skin is warm and dry.  HEENT is normal.  Neck is supple.  Chest is clear to  auscultation with normal expansion.  Cardiovascular exam is regular rate and rhythm.  Abdominal exam nontender or distended. No masses palpated. Extremities show no edema. neuro grossly intact  ECG-sinus rhythm with first-degree AV block.  Left anterior fascicular block.  Cannot rule out septal infarct.  Personally reviewed  A/P  1 supraventricular tachycardia-patient has occasional brief flutters but no sustained palpitations.  We will continue with present dose of Cardizem.  Could consider ablation in the future if she has more frequent, prolonged episodes.  2 history of chest pain-no recurrent symptoms.  Previous nuclear study negative.  No plans for further ischemia evaluation.  3 dyspnea-this is felt secondary to COPD and history of bronchiectasis.  Symptoms well controlled with inhalers.  4 Coronary calcification-continue ASA.  Kirk Ruths, MD

## 2018-06-29 ENCOUNTER — Ambulatory Visit: Payer: Medicare Other | Admitting: Cardiology

## 2018-06-29 ENCOUNTER — Encounter: Payer: Self-pay | Admitting: Cardiology

## 2018-06-29 VITALS — BP 110/70 | HR 71 | Ht 67.0 in | Wt 129.0 lb

## 2018-06-29 DIAGNOSIS — I471 Supraventricular tachycardia: Secondary | ICD-10-CM

## 2018-06-29 DIAGNOSIS — R072 Precordial pain: Secondary | ICD-10-CM | POA: Diagnosis not present

## 2018-06-29 DIAGNOSIS — R0600 Dyspnea, unspecified: Secondary | ICD-10-CM

## 2018-06-29 NOTE — Patient Instructions (Signed)
Medication Instructions:  Continue same medications If you need a refill on your cardiac medications before your next appointment, please call your pharmacy.   Lab work: None ordered   Testing/Procedures: None ordered  Follow-Up: At [facility], you and your health needs are our priority.  As part of our continuing mission to provide you with exceptional heart care, we have created designated Provider Care Teams.  These Care Teams include your primary Cardiologist (physician) and Advanced Practice Providers (APPs -  Physician Assistants and Nurse Practitioners) who all work together to provide you with the care you need, when you need it. . Follow up with Dr.Crenshaw in 1 year call 2 months before to schedule

## 2018-08-02 ENCOUNTER — Other Ambulatory Visit: Payer: Self-pay | Admitting: Cardiology

## 2018-08-02 ENCOUNTER — Other Ambulatory Visit: Payer: Self-pay | Admitting: Acute Care

## 2018-08-02 DIAGNOSIS — L821 Other seborrheic keratosis: Secondary | ICD-10-CM | POA: Diagnosis not present

## 2018-08-02 DIAGNOSIS — Z85828 Personal history of other malignant neoplasm of skin: Secondary | ICD-10-CM | POA: Diagnosis not present

## 2018-08-02 DIAGNOSIS — L570 Actinic keratosis: Secondary | ICD-10-CM | POA: Diagnosis not present

## 2018-08-02 DIAGNOSIS — D2261 Melanocytic nevi of right upper limb, including shoulder: Secondary | ICD-10-CM | POA: Diagnosis not present

## 2018-08-02 DIAGNOSIS — D692 Other nonthrombocytopenic purpura: Secondary | ICD-10-CM | POA: Diagnosis not present

## 2018-08-02 NOTE — Telephone Encounter (Signed)
Rx request sent to pharmacy.  

## 2018-08-16 DIAGNOSIS — H04123 Dry eye syndrome of bilateral lacrimal glands: Secondary | ICD-10-CM | POA: Diagnosis not present

## 2018-09-04 DIAGNOSIS — H2511 Age-related nuclear cataract, right eye: Secondary | ICD-10-CM | POA: Diagnosis not present

## 2018-09-04 DIAGNOSIS — H26491 Other secondary cataract, right eye: Secondary | ICD-10-CM | POA: Diagnosis not present

## 2018-09-04 DIAGNOSIS — H18413 Arcus senilis, bilateral: Secondary | ICD-10-CM | POA: Diagnosis not present

## 2018-09-04 DIAGNOSIS — H26492 Other secondary cataract, left eye: Secondary | ICD-10-CM | POA: Diagnosis not present

## 2018-09-04 DIAGNOSIS — Z961 Presence of intraocular lens: Secondary | ICD-10-CM | POA: Diagnosis not present

## 2018-09-07 ENCOUNTER — Ambulatory Visit: Payer: Medicare Other | Admitting: Pulmonary Disease

## 2018-09-07 ENCOUNTER — Encounter: Payer: Self-pay | Admitting: Pulmonary Disease

## 2018-09-07 VITALS — BP 110/60 | HR 79 | Ht 67.0 in | Wt 131.8 lb

## 2018-09-07 DIAGNOSIS — J439 Emphysema, unspecified: Secondary | ICD-10-CM | POA: Diagnosis not present

## 2018-09-07 DIAGNOSIS — R911 Solitary pulmonary nodule: Secondary | ICD-10-CM | POA: Diagnosis not present

## 2018-09-07 DIAGNOSIS — J479 Bronchiectasis, uncomplicated: Secondary | ICD-10-CM

## 2018-09-07 MED ORDER — UMECLIDINIUM-VILANTEROL 62.5-25 MCG/INH IN AEPB: INHALATION_SPRAY | RESPIRATORY_TRACT | 3 refills | Status: DC

## 2018-09-07 NOTE — Progress Notes (Signed)
Nichole Lawrence    [id number]    1933/06/13  Primary Care Physician:Ramachandran, Mauro Kaufmann, MD  Referring Physician: Merrilee Lawrence, Nichole Lawrence, Nichole Lawrence [id number]  Chief complaint: Follow-up for emphysema, bronchiectasis, lung nodules.  HPI: 85 year old with history of pulmonary emphysema, bronchiectasis, pulmonary nodules.  Previously followed by Dr. Ashok Lawrence She is a non-smoker with exposure to secondhand smoke.  No evidence of alpha-1 antitrypsin deficiency.  Seen in pulmonary clinic in May 2019 with pneumonia, bronchiectasis exacerbation. CT at that time showed worsening pulmonary opacities.  However follow-up chest x-ray showed improvement after treatment with antibiotics  Pets: Dog, no cats, birds. Occupation: Used to work as an Therapist, sports in ED, Marine scientist and nursery. Exposures: No known exposures, no mold, hot tub, Jacuzzi Smoking history: Never smoker exposed to secondhand smoke  Travel history: Originally from [place]. Relevant family history: No significant family history of lung issues.  Interim history: Doing well since last visit.  No issues with cough, sputum production, fevers, chills Continues on Anoro.  She feels that the inhaler has improved her dyspnea a lot.  Outpatient Encounter Medications as of 09/07/2018  Medication Sig  . ANORO ELLIPTA 62.5-25 MCG/INH AEPB INHALE 1 PUFF BY MOUTH ONCE DAILY  . aspirin 81 MG tablet Take 81 mg by mouth daily.  Marland Kitchen CARTIA XT 180 MG 24 hr capsule TAKE 1 CAPSULE BY MOUTH ONCE DAILY  . MAGNESIUM SULFATE PO Take 250 mg by mouth daily.  . Multiple Vitamin (MULTIVITAMIN) capsule Take 1 capsule by mouth daily.  Marland Kitchen Respiratory Therapy Supplies (FLUTTER) DEVI Three times a day for cough and congestion   No facility-administered encounter medications on file as of 09/07/2018.    Physical Exam: Blood pressure 110/60, pulse 79, height _0  (1.702 m), weight 131 lb 12.8 oz (59.8 kg), SpO2 95 %. Gen:      No  acute distress HEENT:  EOMI, sclera anicteric Neck:     No masses; no thyromegaly Lungs:    Clear to auscultation bilaterally; normal respiratory effort CV:         Regular rate and rhythm; no murmurs Abd:      + bowel sounds; soft, non-tender; no palpable masses, no distension Ext:    No edema; adequate peripheral perfusion Skin:      Warm and dry; no rash Neuro: alert and oriented x 3 Psych: normal mood and affect  Data Reviewed: Imaging CT CHEST W/ CONTRAST 07/01/15 nodularity noted in bilateral lungs and multiple lobes upper, middle, lingula, and lower. No pathologic mediastinal adenopathy. Largest nodule measures 1 cm in right upper lobe with some spiculation. No pleural effusion or thickening. No pericardial effusion. very mild apical predominant emphysema. Mild pectus excavatum.   CT CHEST W/O 05/12/17 New 6 mm nodule in left lower lobe. Diffuse bronchial wall thickening with findings consistent with bronchiectasis and right middle and upper lobes bilaterally. Tree-in-bud opacities predominantly within right upper and right middle lobes. No pleural effusion or thickening. No pericardial effusion. No pathologic mediastinal adenopathy.  CT chest without contrast 12/19/2017- 17 but opacity, patchy consolidation, groundglass attenuation, nodularity, bronchiectasis.  Stable 6 mm left lower lobe nodule.  Chest x-ray 02/19/2018- improvement in patchy bilateral infiltrates.  Emphysema. I have reviewed the images personally.  PFT 06/12/17: FVC 2.13 L (74%) FEV1 1.76 L (82%) FEV1/FVC 0.83 FEF 25-75 1.70 L (118%) positive bronchodilator response TLC 5.67 L (103%) RV 134% ERV 80% DLCO corrected 53% 07/30/15: FVC 2.68 L (90%)  FEV1 1.94 L (88%) FEV1/FVC 0.72 FEF 25-75 1.32 L (85%) negative bronchodilator response TLC 6.49 L (117%) RV 149% ERV 117% DLCO uncorrected 54%  LABS 05/11/17 Alpha-1 antitrypsin: MM(154) ESR: 3 CRP: 0.3 ANA:1:160, speckled RA: <14 Anti-CCP: <16  Assessment:    Pulmonary emphysema Doing well on Anoro.  Continue the same.   Bronchiectasis She had an exacerbation in May 2019. Symptoms have improved since then.  She has no new complaints today  Follow-up CT for later this year. She currently is not making any sputum hence not be tested for cultures to rule out MAI She has declined bronchoscopy in the past.  We will continue to monitor her symptoms for any recurrence  Pulmonary nodule Has been stable previously.  Can be followed up on CT scan  Health maintenance 04/26/2018-influenza 07/04/2013- Prevnar 09/27/2016-Pneumovax  Plan/Recommendations: - Continue Anoro - Follow-up CT in May 2020   Nichole Garfinkel MD [facility] 09/07/2018, 11:19 AM  CC: Nichole Seashore, MD

## 2018-09-07 NOTE — Addendum Note (Signed)
Addended by: Maryanna Shape A on: 09/07/2018 11:36 AM   Modules accepted: Orders

## 2018-09-07 NOTE — Patient Instructions (Signed)
I am glad you are doing well with regard to your breathing Continue Anoro.  We will call in refills if needed You have a follow-up CT scheduled for around May 2020 Follow-up in clinic after CT scan for review.

## 2018-09-12 ENCOUNTER — Other Ambulatory Visit: Payer: Self-pay | Admitting: Cardiology

## 2018-09-18 DIAGNOSIS — H26492 Other secondary cataract, left eye: Secondary | ICD-10-CM | POA: Diagnosis not present

## 2018-09-18 DIAGNOSIS — Z961 Presence of intraocular lens: Secondary | ICD-10-CM | POA: Diagnosis not present

## 2019-01-25 DIAGNOSIS — Z7189 Other specified counseling: Secondary | ICD-10-CM | POA: Diagnosis not present

## 2019-01-25 DIAGNOSIS — M150 Primary generalized (osteo)arthritis: Secondary | ICD-10-CM | POA: Diagnosis not present

## 2019-01-25 DIAGNOSIS — M810 Age-related osteoporosis without current pathological fracture: Secondary | ICD-10-CM | POA: Diagnosis not present

## 2019-01-25 DIAGNOSIS — Z0000 Encounter for general adult medical examination without abnormal findings: Secondary | ICD-10-CM | POA: Diagnosis not present

## 2019-01-25 DIAGNOSIS — I471 Supraventricular tachycardia: Secondary | ICD-10-CM | POA: Diagnosis not present

## 2019-01-30 DIAGNOSIS — I471 Supraventricular tachycardia: Secondary | ICD-10-CM | POA: Diagnosis not present

## 2019-01-30 DIAGNOSIS — M150 Primary generalized (osteo)arthritis: Secondary | ICD-10-CM | POA: Diagnosis not present

## 2019-01-30 DIAGNOSIS — M810 Age-related osteoporosis without current pathological fracture: Secondary | ICD-10-CM | POA: Diagnosis not present

## 2019-01-30 DIAGNOSIS — Z7189 Other specified counseling: Secondary | ICD-10-CM | POA: Diagnosis not present

## 2019-01-30 DIAGNOSIS — Z0000 Encounter for general adult medical examination without abnormal findings: Secondary | ICD-10-CM | POA: Diagnosis not present

## 2019-02-07 DIAGNOSIS — M810 Age-related osteoporosis without current pathological fracture: Secondary | ICD-10-CM | POA: Diagnosis not present

## 2019-02-20 ENCOUNTER — Telehealth: Payer: Self-pay | Admitting: *Deleted

## 2019-02-20 NOTE — Telephone Encounter (Signed)

## 2019-02-21 ENCOUNTER — Ambulatory Visit (INDEPENDENT_AMBULATORY_CARE_PROVIDER_SITE_OTHER)
Admission: RE | Admit: 2019-02-21 | Discharge: 2019-02-21 | Disposition: A | Discharge status: Home / Self Care | Payer: Medicare Other | Source: Ambulatory Visit | Attending: Pulmonary Disease | Admitting: Pulmonary Disease

## 2019-02-21 ENCOUNTER — Other Ambulatory Visit: Payer: Self-pay

## 2019-02-21 DIAGNOSIS — I2510 Atherosclerotic heart disease of native coronary artery without angina pectoris: Secondary | ICD-10-CM | POA: Diagnosis not present

## 2019-02-21 DIAGNOSIS — R911 Solitary pulmonary nodule: Secondary | ICD-10-CM

## 2019-02-21 DIAGNOSIS — Q676 Pectus excavatum: Secondary | ICD-10-CM | POA: Diagnosis not present

## 2019-02-21 DIAGNOSIS — J479 Bronchiectasis, uncomplicated: Secondary | ICD-10-CM | POA: Diagnosis not present

## 2019-02-21 DIAGNOSIS — I700 Atherosclerosis of aorta: Secondary | ICD-10-CM | POA: Diagnosis not present

## 2019-02-26 ENCOUNTER — Telehealth: Payer: Self-pay | Admitting: Pulmonary Disease

## 2019-02-26 DIAGNOSIS — J432 Centrilobular emphysema: Secondary | ICD-10-CM | POA: Diagnosis not present

## 2019-02-26 DIAGNOSIS — Z7189 Other specified counseling: Secondary | ICD-10-CM | POA: Diagnosis not present

## 2019-02-26 DIAGNOSIS — M7989 Other specified soft tissue disorders: Secondary | ICD-10-CM | POA: Diagnosis not present

## 2019-02-26 DIAGNOSIS — M79672 Pain in left foot: Secondary | ICD-10-CM | POA: Diagnosis not present

## 2019-02-26 NOTE — Telephone Encounter (Signed)
Returned call to patient and scheduled f/u for 7/31. Nothing further needed.

## 2019-03-15 ENCOUNTER — Other Ambulatory Visit: Payer: Self-pay

## 2019-03-15 ENCOUNTER — Ambulatory Visit: Payer: Medicare Other | Admitting: Pulmonary Disease

## 2019-03-15 ENCOUNTER — Encounter: Payer: Self-pay | Admitting: Pulmonary Disease

## 2019-03-15 VITALS — BP 108/60 | HR 76 | Temp 98.1°F | Ht 66.5 in | Wt 135.2 lb

## 2019-03-15 DIAGNOSIS — J479 Bronchiectasis, uncomplicated: Secondary | ICD-10-CM | POA: Diagnosis not present

## 2019-03-15 NOTE — Progress Notes (Signed)
Nichole Lawrence    [id number]    1932-11-24  Primary Care Physician:Ramachandran, Mauro Kaufmann, MD  Referring Physician: Merrilee Seashore, [address] [place],  [place] [address]  Chief complaint: Follow-up for emphysema, bronchiectasis, lung nodules.  HPI: 85 year old with history of pulmonary emphysema, bronchiectasis, pulmonary nodules.  Previously followed by Dr. Ashok Cordia She is a non-smoker with exposure to secondhand smoke.  No evidence of alpha-1 antitrypsin deficiency.  Seen in pulmonary clinic in May 2019 with pneumonia, bronchiectasis exacerbation. CT at that time showed worsening pulmonary opacities.  However follow-up chest x-ray showed improvement after treatment with antibiotics  Pets: Dog, no cats, birds. Occupation: Used to work as an Therapist, sports in ED, Marine scientist and nursery. Exposures: No known exposures, no mold, hot tub, Jacuzzi Smoking history: Never smoker exposed to secondhand smoke  Travel history: Originally from [place]. Relevant family history: No significant family history of lung issues.  Interim history: Doing well on Anoro.  Feels that this is helping with the breathing Does not need rescue inhaler  Outpatient Encounter Medications as of 03/15/2019  Medication Sig  . aspirin 81 MG tablet Take 81 mg by mouth daily.  Marland Kitchen CARTIA XT 180 MG 24 hr capsule TAKE 1 CAPSULE BY MOUTH ONCE DAILY  . MAGNESIUM SULFATE PO Take 250 mg by mouth daily.  . Multiple Vitamin (MULTIVITAMIN) capsule Take 1 capsule by mouth daily.  Marland Kitchen Respiratory Therapy Supplies (FLUTTER) DEVI Three times a day for cough and congestion  . umeclidinium-vilanterol (ANORO ELLIPTA) 62.5-25 MCG/INH AEPB INHALE 1 PUFF BY MOUTH ONCE DAILY   No facility-administered encounter medications on file as of 03/15/2019.    Physical Exam: Blood pressure 108/60, pulse 76, temperature 98.1 F (36.7 C), temperature source Oral, height 5' 6.5" (1.689 m), weight 135 lb 3.2 oz (61.3 kg), SpO2 94 %.  Gen:      No acute distress HEENT:  EOMI, sclera anicteric Neck:     No masses; no thyromegaly Lungs:    Clear to auscultation bilaterally; normal respiratory effort CV:         Regular rate and rhythm; no murmurs Abd:      + bowel sounds; soft, non-tender; no palpable masses, no distension Ext:    No edema; adequate peripheral perfusion Skin:      Warm and dry; no rash Neuro: alert and oriented x 3 Psych: normal mood and affect  Data Reviewed: Imaging CT CHEST W/ CONTRAST 07/01/15 nodularity noted in bilateral lungs and multiple lobes upper, middle, lingula, and lower. No pathologic mediastinal adenopathy. Largest nodule measures 1 cm in right upper lobe with some spiculation. No pleural effusion or thickening. No pericardial effusion. very mild apical predominant emphysema. Mild pectus excavatum.   CT CHEST W/O 05/12/17 New 6 mm nodule in left lower lobe. Diffuse bronchial wall thickening with findings consistent with bronchiectasis and right middle and upper lobes bilaterally. Tree-in-bud opacities predominantly within right upper and right middle lobes. No pleural effusion or thickening. No pericardial effusion. No pathologic mediastinal adenopathy.  CT chest without contrast 12/19/2017- 17 but opacity, patchy consolidation, groundglass attenuation, nodularity, bronchiectasis.  Stable 6 mm left lower lobe nodule.  CT high-resolution 02/21/2019- no interstitial lung disease.  Nodules, tree-in-bud bilaterally.  More than the right upper and middle lobe.  Improved compared to prior. I have reviewed the images personally.  PFT 06/12/17: FVC 2.13 L (74%) FEV1 1.76 L (82%) FEV1/FVC 0.83 FEF 25-75 1.70 L (118%) positive bronchodilator response TLC 5.67  L (103%) RV 134% ERV 80% DLCO corrected 53% 07/30/15: FVC 2.68 L (90%) FEV1 1.94 L (88%) FEV1/FVC 0.72 FEF 25-75 1.32 L (85%) negative bronchodilator response TLC 6.49 L (117%) RV 149% ERV 117% DLCO uncorrected 54%  LABS 05/11/17 Alpha-1  antitrypsin: MM(154) ESR: 3 CRP: 0.3 ANA:1:160, speckled RA: <14 Anti-CCP: <16  Assessment:  Pulmonary emphysema Doing well on Anoro.  Continue the same.   Bronchiectasis She had an exacerbation in May 2019. Symptoms have improved since then.  She has no new complaints today  Follow-up CT reviewed with improvement in nodularity and bronchial inflammation.  There is no evidence of interstitial lung disease She currently is not making any sputum hence not be tested for cultures to rule out MAI She has declined bronchoscopy in the past.  We will continue to monitor her symptoms for any recurrence  Health maintenance 04/26/2018-influenza 07/04/2013- Prevnar 09/27/2016-Pneumovax  Plan/Recommendations: - Continue Anoro  [name] MD [facility] 03/15/2019, 9:14 AM  CC: Merrilee Seashore, MD

## 2019-03-15 NOTE — Patient Instructions (Signed)
I am glad you are doing well with your breathing Continue the Anoro inhaler His CT scan shows improving inflammation Follow-up in 1 year.  Please give [place] a call sooner if there is any change in his symptoms.

## 2019-04-11 ENCOUNTER — Other Ambulatory Visit: Payer: Self-pay | Admitting: Pulmonary Disease

## 2019-07-16 NOTE — Progress Notes (Signed)
HPI: FU chest painand SVT. Echocardiogram December 2015 showed normal LV function, mild aortic insufficiency and mild left atrial enlargement.Nuclear studyAugust 2018 showed normal perfusion and ejection fraction 72%. Pulmonary function tests October 2018 showed moderately decreased DLCO and restrictive disease. Chest CT September 2018 showed bronchiectasis and pulmonary nodule in the left base. Follow-up recommended 6-12 months. LAD atherosclerosis noted. Now followed by pulmonary. Since last seenthe patient denies any dyspnea on exertion, orthopnea, PND, pedal edema, palpitations, syncope or chest pain.   Current Outpatient Medications  Medication Sig Dispense Refill  . ANORO ELLIPTA 62.5-25 MCG/INH AEPB Inhale 1 puff by mouth once daily 60 each 10  . aspirin 81 MG tablet Take 81 mg by mouth daily.    Marland Kitchen CARTIA XT 180 MG 24 hr capsule TAKE 1 CAPSULE BY MOUTH ONCE DAILY 30 capsule 10  . MAGNESIUM SULFATE PO Take 250 mg by mouth daily.    . Multiple Vitamin (MULTIVITAMIN) capsule Take 1 capsule by mouth daily.    Marland Kitchen Respiratory Therapy Supplies (FLUTTER) DEVI Three times a day for cough and congestion 1 each 0   No current facility-administered medications for this visit.      Past Medical History:  Diagnosis Date  . Breast cancer ([facility])   . SVT (supraventricular tachycardia) ([facility])     Past Surgical History:  Procedure Laterality Date  . BACK SURGERY    . MASTECTOMY Right   . TONSILLECTOMY    . TUBAL LIGATION      Social History   Socioeconomic History  . Marital status: Widowed    Spouse name: Not on file  . Number of children: 2  . Years of education: Not on file  . Highest education level: Not on file  Occupational History  . Occupation: Retired Animal nutritionist  . Financial resource strain: Not on file  . Food insecurity    Worry: Not on file    Inability: Not on file  . Transportation needs    Medical: Not on file    Non-medical: Not on file  Tobacco  Use  . Smoking status: Passive Smoke Exposure - Never Smoker  . Smokeless tobacco: Never Used  . Tobacco comment: Husband and Father   Substance and Sexual Activity  . Alcohol use: Yes    Alcohol/week: 0.0 standard drinks    Comment: Occasional  . Drug use: No  . Sexual activity: Not on file  Lifestyle  . Physical activity    Days per week: Not on file    Minutes per session: Not on file  . Stress: Not on file  Relationships  . Social Herbalist on phone: Not on file    Gets together: Not on file    Attends religious service: Not on file    Active member of club or organization: Not on file    Attends meetings of clubs or organizations: Not on file    Relationship status: Not on file  . Intimate partner violence    Fear of current or ex partner: Not on file    Emotionally abused: Not on file    Physically abused: Not on file    Forced sexual activity: Not on file  Other Topics Concern  . Not on file  Social History Narrative   [facility] (05/11/17):   Originally from [place]. Lived near a paper [place]. Moved to [place] in 2006. Primarily worked as an Therapist, sports in the E.D., PACU, and in nursery. No  known TB exposure. No mold, bird, or hot tub exposure. Remotely had a swimming pool. Enjoys exercising and walking. She does have a dog and enjoys gardening.     Family History  Problem Relation Age of Onset  . Heart disease Mother   . Emphysema Mother        never smoked  . Rheum arthritis Mother   . Breast cancer Mother   . Lung cancer Father   . CAD Brother     ROS: no fevers or chills, productive cough, hemoptysis, dysphasia, odynophagia, melena, hematochezia, dysuria, hematuria, rash, seizure activity, orthopnea, PND, pedal edema, claudication. Remaining systems are negative.  Physical Exam: Well-developed well-nourished in no acute distress.  Skin is warm and dry.  HEENT is normal.  Neck is supple.  Chest is clear to auscultation with normal expansion.   Cardiovascular exam is regular rate and rhythm.  Abdominal exam nontender or distended. No masses palpated. Extremities show no edema. neuro grossly intact  ECG-sinus rhythm with first-degree AV block, left anterior fascicular block, no ST changes, low voltage.  Personally reviewed  A/P  1 supraventricular tachycardia-patient has had no recurrences by report.  Continue Cardizem.  We will consider referral for ablation in the future if she has more frequent episodes.  2 coronary calcification-continue aspirin.  3 history of chest pain-previous nuclear study negative and she has had no recurrences.  4 dyspnea-felt secondary to COPD and bronchiectasis.  Continue pulmonary toilet.  Kirk Ruths, MD

## 2019-07-17 DIAGNOSIS — E7800 Pure hypercholesterolemia, unspecified: Secondary | ICD-10-CM | POA: Diagnosis not present

## 2019-07-17 DIAGNOSIS — Z0000 Encounter for general adult medical examination without abnormal findings: Secondary | ICD-10-CM | POA: Diagnosis not present

## 2019-07-19 ENCOUNTER — Telehealth: Payer: Self-pay | Admitting: Cardiology

## 2019-07-19 NOTE — Telephone Encounter (Signed)
Patient is calling requesting her daughter attend her appointment on 07/24/19 due to her needing help understanding the information the Doctor gives.

## 2019-07-19 NOTE — Telephone Encounter (Signed)
Okay given for daughter to come to appointment.

## 2019-07-24 ENCOUNTER — Ambulatory Visit: Payer: Medicare Other | Admitting: Cardiology

## 2019-07-24 ENCOUNTER — Encounter: Payer: Self-pay | Admitting: Cardiology

## 2019-07-24 ENCOUNTER — Other Ambulatory Visit: Payer: Self-pay

## 2019-07-24 VITALS — BP 133/86 | HR 75 | Temp 98.4°F | Ht 66.5 in | Wt 131.4 lb

## 2019-07-24 DIAGNOSIS — M810 Age-related osteoporosis without current pathological fracture: Secondary | ICD-10-CM | POA: Diagnosis not present

## 2019-07-24 DIAGNOSIS — I471 Supraventricular tachycardia: Secondary | ICD-10-CM | POA: Diagnosis not present

## 2019-07-24 DIAGNOSIS — R0600 Dyspnea, unspecified: Secondary | ICD-10-CM

## 2019-07-24 DIAGNOSIS — E875 Hyperkalemia: Secondary | ICD-10-CM | POA: Diagnosis not present

## 2019-07-24 NOTE — Patient Instructions (Signed)

## 2019-08-01 ENCOUNTER — Other Ambulatory Visit: Payer: Self-pay | Admitting: Cardiology

## 2019-08-01 NOTE — Telephone Encounter (Signed)
Rx has been sent to the pharmacy electronically. ° °

## 2019-10-09 DIAGNOSIS — D692 Other nonthrombocytopenic purpura: Secondary | ICD-10-CM | POA: Diagnosis not present

## 2019-10-09 DIAGNOSIS — L814 Other melanin hyperpigmentation: Secondary | ICD-10-CM | POA: Diagnosis not present

## 2019-10-09 DIAGNOSIS — Z85828 Personal history of other malignant neoplasm of skin: Secondary | ICD-10-CM | POA: Diagnosis not present

## 2019-10-09 DIAGNOSIS — D225 Melanocytic nevi of trunk: Secondary | ICD-10-CM | POA: Diagnosis not present

## 2019-10-09 DIAGNOSIS — L298 Other pruritus: Secondary | ICD-10-CM | POA: Diagnosis not present

## 2019-10-09 DIAGNOSIS — D0472 Carcinoma in situ of skin of left lower limb, including hip: Secondary | ICD-10-CM | POA: Diagnosis not present

## 2019-10-22 DIAGNOSIS — Z1231 Encounter for screening mammogram for malignant neoplasm of breast: Secondary | ICD-10-CM | POA: Diagnosis not present

## 2020-01-21 DIAGNOSIS — J432 Centrilobular emphysema: Secondary | ICD-10-CM | POA: Diagnosis not present

## 2020-01-21 DIAGNOSIS — M810 Age-related osteoporosis without current pathological fracture: Secondary | ICD-10-CM | POA: Diagnosis not present

## 2020-01-21 DIAGNOSIS — E875 Hyperkalemia: Secondary | ICD-10-CM | POA: Diagnosis not present

## 2020-01-21 DIAGNOSIS — Z79899 Other long term (current) drug therapy: Secondary | ICD-10-CM | POA: Diagnosis not present

## 2020-01-21 DIAGNOSIS — Z0000 Encounter for general adult medical examination without abnormal findings: Secondary | ICD-10-CM | POA: Diagnosis not present

## 2020-01-21 DIAGNOSIS — I471 Supraventricular tachycardia: Secondary | ICD-10-CM | POA: Diagnosis not present

## 2020-01-28 DIAGNOSIS — M810 Age-related osteoporosis without current pathological fracture: Secondary | ICD-10-CM | POA: Diagnosis not present

## 2020-01-28 DIAGNOSIS — I471 Supraventricular tachycardia: Secondary | ICD-10-CM | POA: Diagnosis not present

## 2020-01-28 DIAGNOSIS — M150 Primary generalized (osteo)arthritis: Secondary | ICD-10-CM | POA: Diagnosis not present

## 2020-01-28 DIAGNOSIS — Z0000 Encounter for general adult medical examination without abnormal findings: Secondary | ICD-10-CM | POA: Diagnosis not present

## 2020-01-28 DIAGNOSIS — R319 Hematuria, unspecified: Secondary | ICD-10-CM | POA: Diagnosis not present

## 2020-01-28 DIAGNOSIS — J432 Centrilobular emphysema: Secondary | ICD-10-CM | POA: Diagnosis not present

## 2020-03-10 DIAGNOSIS — H2513 Age-related nuclear cataract, bilateral: Secondary | ICD-10-CM | POA: Diagnosis not present

## 2020-03-12 ENCOUNTER — Other Ambulatory Visit: Payer: Self-pay | Admitting: Pulmonary Disease

## 2020-03-16 ENCOUNTER — Telehealth: Payer: Self-pay | Admitting: Pulmonary Disease

## 2020-03-16 MED ORDER — ANORO ELLIPTA 62.5-25 MCG/INH IN AEPB: 1.0000 | INHALATION_SPRAY | Freq: Every day | RESPIRATORY_TRACT | 11 refills | Status: DC

## 2020-03-16 NOTE — Telephone Encounter (Signed)
Rx for anoro has been sent to preferred pharmacy for pt. Called and spoke with pt letting her know this had been done and she verbalized understanding. Nothing further needed.

## 2020-05-07 ENCOUNTER — Encounter: Payer: Self-pay | Admitting: Pulmonary Disease

## 2020-05-07 ENCOUNTER — Other Ambulatory Visit: Payer: Self-pay

## 2020-05-07 ENCOUNTER — Ambulatory Visit: Payer: Medicare Other | Admitting: Pulmonary Disease

## 2020-05-07 VITALS — BP 112/72 | HR 68 | Temp 97.2°F | Ht 66.0 in | Wt 131.8 lb

## 2020-05-07 DIAGNOSIS — J479 Bronchiectasis, uncomplicated: Secondary | ICD-10-CM

## 2020-05-07 DIAGNOSIS — J439 Emphysema, unspecified: Secondary | ICD-10-CM

## 2020-05-07 NOTE — Progress Notes (Signed)
Nichole Lawrence    [id number]    1933-07-30  Primary Care Physician:Ramachandran, Mauro Kaufmann, MD  Referring Physician: Merrilee Seashore, [address] [place],  [place] [address]  Chief complaint: Follow-up for emphysema, bronchiectasis, lung nodules.  HPI: 86 year old with history of pulmonary emphysema, bronchiectasis, pulmonary nodules.  Previously followed by Dr. Ashok Cordia She is a non-smoker with exposure to secondhand smoke.  No evidence of alpha-1 antitrypsin deficiency.  Seen in pulmonary clinic in May 2019 with pneumonia, bronchiectasis exacerbation. CT at that time showed worsening pulmonary opacities.  However follow-up chest x-ray showed improvement after treatment with antibiotics  Pets: Dog, no cats, birds. Occupation: Used to work as an Therapist, sports in ED, Marine scientist and nursery. Exposures: No known exposures, no mold, hot tub, Jacuzzi Smoking history: Never smoker exposed to secondhand smoke  Travel history: Originally from [place]. Relevant family history: No significant family history of lung issues.  Interim history: Doing well on Anoro.  Feels that this is helping with the breathing Does not need rescue inhaler  For the past week she has had nasal congestion, postnasal drip with hoarseness of voice.  Outpatient Encounter Medications as of 05/07/2020  Medication Sig  . aspirin 81 MG tablet Take 81 mg by mouth daily.  Marland Kitchen CARTIA XT 180 MG 24 hr capsule Take 1 capsule by mouth once daily  . MAGNESIUM SULFATE PO Take 250 mg by mouth daily.  . Multiple Vitamin (MULTIVITAMIN) capsule Take 1 capsule by mouth daily.  Marland Kitchen Respiratory Therapy Supplies (FLUTTER) DEVI Three times a day for cough and congestion  . umeclidinium-vilanterol (ANORO ELLIPTA) 62.5-25 MCG/INH AEPB Inhale 1 puff into the lungs daily.   No facility-administered encounter medications on file as of 05/07/2020.   Physical Exam: Blood pressure 112/72, pulse 68, temperature (!) 97.2 F (36.2 C),  temperature source Oral, height '5\' 6"'  (1.676 m), weight 131 lb 12.8 oz (59.8 kg), SpO2 95 %. Gen:      No acute distress HEENT:  EOMI, sclera anicteric Neck:     No masses; no thyromegaly Lungs:    Clear to auscultation bilaterally; normal respiratory effort CV:         Regular rate and rhythm; no murmurs Abd:      + bowel sounds; soft, non-tender; no palpable masses, no distension Ext:    No edema; adequate peripheral perfusion Skin:      Warm and dry; no rash Neuro: alert and oriented x 3 Psych: normal mood and affect  Data Reviewed: Imaging CT CHEST W/ CONTRAST 07/01/15 nodularity noted in bilateral lungs and multiple lobes upper, middle, lingula, and lower. No pathologic mediastinal adenopathy. Largest nodule measures 1 cm in right upper lobe with some spiculation. No pleural effusion or thickening. No pericardial effusion. very mild apical predominant emphysema. Mild pectus excavatum.   CT CHEST W/O 05/12/17 New 6 mm nodule in left lower lobe. Diffuse bronchial wall thickening with findings consistent with bronchiectasis and right middle and upper lobes bilaterally. Tree-in-bud opacities predominantly within right upper and right middle lobes. No pleural effusion or thickening. No pericardial effusion. No pathologic mediastinal adenopathy.  CT chest without contrast 12/19/2017- 17 but opacity, patchy consolidation, groundglass attenuation, nodularity, bronchiectasis.  Stable 6 mm left lower lobe nodule.  CT high-resolution 02/21/2019- no interstitial lung disease.  Nodules, tree-in-bud bilaterally.  More than the right upper and middle lobe.  Improved compared to prior. I have reviewed the images personally.  PFT 06/12/17: FVC 2.13 L (74%)  FEV1 1.76 L (82%) FEV1/FVC 0.83 FEF 25-75 1.70 L (118%) positive bronchodilator response TLC 5.67 L (103%) RV 134% ERV 80% DLCO corrected 53% 07/30/15: FVC 2.68 L (90%) FEV1 1.94 L (88%) FEV1/FVC 0.72 FEF 25-75 1.32 L (85%) negative bronchodilator  response TLC 6.49 L (117%) RV 149% ERV 117% DLCO uncorrected 54%  LABS 05/11/17 Alpha-1 antitrypsin: MM(154) ESR: 3 CRP: 0.3 ANA:1:160, speckled RA: <14 Anti-CCP: <16  Assessment:  Pulmonary emphysema Doing well on Anoro.  Continue the same.  Allergic rhinitis with postnasal drip Symptoms for the past week consistent with allergic rhinitis with postnasal drip Advised over-the-counter steroid nasal spray and antihistamine.   Bronchiectasis She had an exacerbation in May 2019. Symptoms have improved since then.  She has no new complaints today  Follow-up CT reviewed with improvement in nodularity and bronchial inflammation.  There is no evidence of interstitial lung disease She currently is not making any sputum hence not be tested for cultures to rule out MAI She has declined bronchoscopy in the past.  We will continue to monitor her symptoms for any recurrence  Health maintenance 07/04/2013- Prevnar 09/27/2016-Pneumovax  She is up-to-date with Covid vaccination  Plan/Recommendations: - Continue Anoro - OTC steroid nasal spray, antihistamine  [name] MD [facility] 05/07/2020, 8:59 AM  CC: Merrilee Seashore, MD

## 2020-05-07 NOTE — Patient Instructions (Signed)
You can use Mucinex over-the-counter which is a steroid nasal spray for nasal congestion and postnasal drip Use Benadryl or chlorpheniramine over-the-counter these are antihistamines that can help.  Watch for symptoms of sleepiness  Continue the Anoro  Follow-up in 1 year

## 2020-07-30 ENCOUNTER — Other Ambulatory Visit: Payer: Self-pay | Admitting: Cardiology

## 2020-08-11 NOTE — Progress Notes (Signed)
HPI: FU chest painand SVT. Echocardiogram December 2015 showed normal LV function, mild aortic insufficiency and mild left atrial enlargement.Nuclear studyAugust 2018 showed normal perfusion and ejection fraction 72%. Pulmonary function tests October 2018 showed moderately decreased DLCO and restrictive disease. Chest CT September 2018 showed bronchiectasis and pulmonary nodule in the left base. Follow-up recommended 6-12 months. LAD atherosclerosis noted. Now followed by pulmonary. Since last seenthe patient has dyspnea with more extreme activities but not with routine activities. It is relieved with rest. It is not associated with chest pain. There is no orthopnea, PND or pedal edema. There is no syncope or palpitations. There is no exertional chest pain.   Current Outpatient Medications  Medication Sig Dispense Refill  . aspirin 81 MG tablet Take 81 mg by mouth daily.    Marland Kitchen diltiazem (CARDIZEM CD) 180 MG 24 hr capsule Take 1 capsule by mouth once daily 90 capsule 0  . MAGNESIUM SULFATE PO Take 250 mg by mouth daily.    . Multiple Vitamin (MULTIVITAMIN) capsule Take 1 capsule by mouth daily.    Marland Kitchen Respiratory Therapy Supplies (FLUTTER) DEVI Three times a day for cough and congestion 1 each 0  . umeclidinium-vilanterol (ANORO ELLIPTA) 62.5-25 MCG/INH AEPB Inhale 1 puff into the lungs daily. 60 each 11   No current facility-administered medications for this visit.     Past Medical History:  Diagnosis Date  . Breast cancer ([place])   . SVT (supraventricular tachycardia) ([facility])     Past Surgical History:  Procedure Laterality Date  . BACK SURGERY    . MASTECTOMY Right   . TONSILLECTOMY    . TUBAL LIGATION      Social History   Socioeconomic History  . Marital status: Widowed    Spouse name: Not on file  . Number of children: 2  . Years of education: Not on file  . Highest education level: Not on file  Occupational History  . Occupation: Retired Therapist, sports  Tobacco Use  .  Smoking status: Passive Smoke Exposure - Never Smoker  . Smokeless tobacco: Never Used  . Tobacco comment: Husband and Father   Vaping Use  . Vaping Use: Never used  Substance and Sexual Activity  . Alcohol use: Yes    Alcohol/week: 0.0 standard drinks    Comment: Occasional  . Drug use: No  . Sexual activity: Not on file  Other Topics Concern  . Not on file  Social History Narrative   [facility] (05/11/17):   Originally from [place]. [place]. Moved to [place] in 2006. Primarily worked as an Therapist, sports in the E.D., PACU, and in nursery. No known TB exposure. No mold, bird, or hot tub exposure. Remotely had a swimming pool. Enjoys exercising and walking. She does have a dog and enjoys gardening.    Social Determinants of Health   Financial Resource Strain: Not on file  Food Insecurity: Not on file  Transportation Needs: Not on file  Physical Activity: Not on file  Stress: Not on file  Social Connections: Not on file  Intimate Partner Violence: Not on file    Family History  Problem Relation Age of Onset  . Heart disease Mother   . Emphysema Mother        never smoked  . Rheum arthritis Mother   . Breast cancer Mother   . Lung cancer Father   . CAD Brother     ROS: no fevers or chills, productive cough, hemoptysis, dysphasia, odynophagia,  melena, hematochezia, dysuria, hematuria, rash, seizure activity, orthopnea, PND, pedal edema, claudication. Remaining systems are negative.  Physical Exam: Well-developed well-nourished in no acute distress.  Skin is warm and dry.  HEENT is normal.  Neck is supple.  Chest is clear to auscultation with normal expansion.  Cardiovascular exam is regular rate and rhythm.  Abdominal exam nontender or distended. No masses palpated. Extremities show no edema. neuro grossly intact  ECG-normal sinus rhythm with first-degree AV block, left axis deviation, cannot rule out septal infarct.  Personally reviewed  A/P  1 SVT-patient has  had no recurrences since last office visit.  Continue Cardizem at present dose.  We will consider referral for ablation in the future if she has more frequent episodes.  2 coronary calcification-continue aspirin.  We discussed a statin previously but she would prefer to avoid.  3 history of dyspnea-this is felt secondary to bronchiectasis and COPD.  Follow-up pulmonary.  Olga Millers, MD

## 2020-08-17 ENCOUNTER — Ambulatory Visit: Payer: Medicare Other | Admitting: Cardiology

## 2020-08-17 ENCOUNTER — Other Ambulatory Visit: Payer: Self-pay

## 2020-08-17 ENCOUNTER — Encounter: Payer: Self-pay | Admitting: Cardiology

## 2020-08-17 VITALS — BP 130/78 | HR 68 | Ht 67.5 in | Wt 130.0 lb

## 2020-08-17 DIAGNOSIS — I2510 Atherosclerotic heart disease of native coronary artery without angina pectoris: Secondary | ICD-10-CM

## 2020-08-17 DIAGNOSIS — I471 Supraventricular tachycardia: Secondary | ICD-10-CM

## 2020-08-17 DIAGNOSIS — R0600 Dyspnea, unspecified: Secondary | ICD-10-CM

## 2020-08-17 NOTE — Patient Instructions (Signed)

## 2020-08-20 NOTE — Addendum Note (Signed)
Addended by: Raelyn Number on: 08/20/2020 04:15 PM   Modules accepted: Orders

## 2020-08-25 DIAGNOSIS — R319 Hematuria, unspecified: Secondary | ICD-10-CM | POA: Diagnosis not present

## 2020-11-02 ENCOUNTER — Other Ambulatory Visit: Payer: Self-pay | Admitting: Cardiology

## 2020-11-05 ENCOUNTER — Telehealth: Payer: Self-pay | Admitting: Cardiology

## 2020-11-05 MED ORDER — DILTIAZEM HCL ER COATED BEADS 180 MG PO CP24: 180.0000 mg | ORAL_CAPSULE | Freq: Every day | ORAL | 3 refills | Status: DC

## 2020-11-05 NOTE — Telephone Encounter (Signed)
 *  STAT* If patient is at the pharmacy, call can be transferred to refill team.   1. Which medications need to be refilled? (please list name of each medication and dose if known)   diltiazem (CARDIZEM CD) 180 MG 24 hr capsule  2. Which pharmacy/location (including street and city if local pharmacy) is medication to be sent to?  [place], [place] - [address]   3. Do they need a 30 day or 90 day supply? 90 days  Patient has one day dosage left

## 2020-12-11 DIAGNOSIS — D692 Other nonthrombocytopenic purpura: Secondary | ICD-10-CM | POA: Diagnosis not present

## 2020-12-11 DIAGNOSIS — D0462 Carcinoma in situ of skin of left upper limb, including shoulder: Secondary | ICD-10-CM | POA: Diagnosis not present

## 2020-12-11 DIAGNOSIS — D225 Melanocytic nevi of trunk: Secondary | ICD-10-CM | POA: Diagnosis not present

## 2020-12-11 DIAGNOSIS — L918 Other hypertrophic disorders of the skin: Secondary | ICD-10-CM | POA: Diagnosis not present

## 2020-12-11 DIAGNOSIS — Z85828 Personal history of other malignant neoplasm of skin: Secondary | ICD-10-CM | POA: Diagnosis not present

## 2020-12-11 DIAGNOSIS — L814 Other melanin hyperpigmentation: Secondary | ICD-10-CM | POA: Diagnosis not present

## 2020-12-11 DIAGNOSIS — L821 Other seborrheic keratosis: Secondary | ICD-10-CM | POA: Diagnosis not present

## 2020-12-11 DIAGNOSIS — I788 Other diseases of capillaries: Secondary | ICD-10-CM | POA: Diagnosis not present

## 2020-12-11 DIAGNOSIS — D485 Neoplasm of uncertain behavior of skin: Secondary | ICD-10-CM | POA: Diagnosis not present

## 2020-12-11 DIAGNOSIS — L570 Actinic keratosis: Secondary | ICD-10-CM | POA: Diagnosis not present

## 2020-12-28 DIAGNOSIS — H353210 Exudative age-related macular degeneration, right eye, stage unspecified: Secondary | ICD-10-CM | POA: Diagnosis not present

## 2020-12-29 DIAGNOSIS — H353211 Exudative age-related macular degeneration, right eye, with active choroidal neovascularization: Secondary | ICD-10-CM | POA: Diagnosis not present

## 2020-12-29 DIAGNOSIS — H353122 Nonexudative age-related macular degeneration, left eye, intermediate dry stage: Secondary | ICD-10-CM | POA: Diagnosis not present

## 2021-01-27 DIAGNOSIS — H353211 Exudative age-related macular degeneration, right eye, with active choroidal neovascularization: Secondary | ICD-10-CM | POA: Diagnosis not present

## 2021-02-24 DIAGNOSIS — H353211 Exudative age-related macular degeneration, right eye, with active choroidal neovascularization: Secondary | ICD-10-CM | POA: Diagnosis not present

## 2021-02-24 DIAGNOSIS — Z1231 Encounter for screening mammogram for malignant neoplasm of breast: Secondary | ICD-10-CM | POA: Diagnosis not present

## 2021-03-02 ENCOUNTER — Emergency Department (HOSPITAL_BASED_OUTPATIENT_CLINIC_OR_DEPARTMENT_OTHER): Payer: Medicare Other

## 2021-03-02 ENCOUNTER — Observation Stay (HOSPITAL_BASED_OUTPATIENT_CLINIC_OR_DEPARTMENT_OTHER)
Admission: EM | Admit: 2021-03-02 | Discharge: 2021-03-03 | Disposition: A | Discharge status: Home / Self Care | Payer: Medicare Other | Attending: Family Medicine | Admitting: Family Medicine

## 2021-03-02 ENCOUNTER — Observation Stay (HOSPITAL_COMMUNITY): Payer: Medicare Other

## 2021-03-02 ENCOUNTER — Encounter (HOSPITAL_BASED_OUTPATIENT_CLINIC_OR_DEPARTMENT_OTHER): Payer: Self-pay

## 2021-03-02 ENCOUNTER — Other Ambulatory Visit: Payer: Self-pay

## 2021-03-02 DIAGNOSIS — R202 Paresthesia of skin: Secondary | ICD-10-CM | POA: Diagnosis not present

## 2021-03-02 DIAGNOSIS — I6782 Cerebral ischemia: Secondary | ICD-10-CM | POA: Insufficient documentation

## 2021-03-02 DIAGNOSIS — I6381 Other cerebral infarction due to occlusion or stenosis of small artery: Secondary | ICD-10-CM | POA: Diagnosis not present

## 2021-03-02 DIAGNOSIS — R41841 Cognitive communication deficit: Secondary | ICD-10-CM | POA: Insufficient documentation

## 2021-03-02 DIAGNOSIS — I10 Essential (primary) hypertension: Secondary | ICD-10-CM | POA: Insufficient documentation

## 2021-03-02 DIAGNOSIS — Z7982 Long term (current) use of aspirin: Secondary | ICD-10-CM | POA: Insufficient documentation

## 2021-03-02 DIAGNOSIS — I480 Paroxysmal atrial fibrillation: Secondary | ICD-10-CM | POA: Diagnosis not present

## 2021-03-02 DIAGNOSIS — I679 Cerebrovascular disease, unspecified: Secondary | ICD-10-CM | POA: Diagnosis not present

## 2021-03-02 DIAGNOSIS — E119 Type 2 diabetes mellitus without complications: Secondary | ICD-10-CM | POA: Diagnosis not present

## 2021-03-02 DIAGNOSIS — Z7722 Contact with and (suspected) exposure to environmental tobacco smoke (acute) (chronic): Secondary | ICD-10-CM | POA: Insufficient documentation

## 2021-03-02 DIAGNOSIS — R29818 Other symptoms and signs involving the nervous system: Secondary | ICD-10-CM | POA: Diagnosis not present

## 2021-03-02 DIAGNOSIS — R531 Weakness: Secondary | ICD-10-CM | POA: Diagnosis present

## 2021-03-02 DIAGNOSIS — R414 Neurologic neglect syndrome: Secondary | ICD-10-CM | POA: Diagnosis not present

## 2021-03-02 DIAGNOSIS — J449 Chronic obstructive pulmonary disease, unspecified: Secondary | ICD-10-CM | POA: Insufficient documentation

## 2021-03-02 DIAGNOSIS — Z79899 Other long term (current) drug therapy: Secondary | ICD-10-CM | POA: Insufficient documentation

## 2021-03-02 DIAGNOSIS — Z20822 Contact with and (suspected) exposure to covid-19: Secondary | ICD-10-CM | POA: Insufficient documentation

## 2021-03-02 DIAGNOSIS — Y900 Blood alcohol level of less than 20 mg/100 ml: Secondary | ICD-10-CM | POA: Insufficient documentation

## 2021-03-02 DIAGNOSIS — Z853 Personal history of malignant neoplasm of breast: Secondary | ICD-10-CM | POA: Insufficient documentation

## 2021-03-02 DIAGNOSIS — G459 Transient cerebral ischemic attack, unspecified: Secondary | ICD-10-CM

## 2021-03-02 DIAGNOSIS — I639 Cerebral infarction, unspecified: Principal | ICD-10-CM

## 2021-03-02 LAB — CBC
HCT: 46.6 % — ABNORMAL HIGH (ref 36.0–46.0)
Hemoglobin: 15.7 g/dL — ABNORMAL HIGH (ref 12.0–15.0)
MCH: 31.1 pg (ref 26.0–34.0)
MCHC: 33.7 g/dL (ref 30.0–36.0)
MCV: 92.3 fL (ref 80.0–100.0)
Platelets: 210 10*3/uL (ref 150–400)
RBC: 5.05 MIL/uL (ref 3.87–5.11)
RDW: 12.3 % (ref 11.5–15.5)
WBC: 4.6 10*3/uL (ref 4.0–10.5)
nRBC: 0 % (ref 0.0–0.2)

## 2021-03-02 LAB — DIFFERENTIAL
Abs Immature Granulocytes: 0.01 10*3/uL (ref 0.00–0.07)
Basophils Absolute: 0.1 10*3/uL (ref 0.0–0.1)
Basophils Relative: 2 %
Eosinophils Absolute: 0.2 10*3/uL (ref 0.0–0.5)
Eosinophils Relative: 4 %
Immature Granulocytes: 0 %
Lymphocytes Relative: 30 %
Lymphs Abs: 1.4 10*3/uL (ref 0.7–4.0)
Monocytes Absolute: 0.5 10*3/uL (ref 0.1–1.0)
Monocytes Relative: 10 %
Neutro Abs: 2.5 10*3/uL (ref 1.7–7.7)
Neutrophils Relative %: 54 %

## 2021-03-02 LAB — TROPONIN I (HIGH SENSITIVITY)
Troponin I (High Sensitivity): 7 ng/L (ref <18)
Troponin I (High Sensitivity): 6 ng/L (ref <18)

## 2021-03-02 LAB — COMPREHENSIVE METABOLIC PANEL
ALT: 16 U/L (ref 0–44)
AST: 24 U/L (ref 15–41)
Albumin: 4.5 g/dL (ref 3.5–5.0)
Alkaline Phosphatase: 112 U/L (ref 38–126)
Anion gap: 10 (ref 5–15)
BUN: 11 mg/dL (ref 8–23)
CO2: 27 mmol/L (ref 22–32)
Calcium: 10.1 mg/dL (ref 8.9–10.3)
Chloride: 98 mmol/L (ref 98–111)
Creatinine, Ser: 0.68 mg/dL (ref 0.44–1.00)
GFR, Estimated: >60 mL/min (ref >60)
Glucose, Bld: 105 mg/dL — ABNORMAL HIGH (ref 70–99)
Potassium: 4.3 mmol/L (ref 3.5–5.1)
Sodium: 135 mmol/L (ref 135–145)
Total Bilirubin: 1 mg/dL (ref 0.3–1.2)
Total Protein: 7.1 g/dL (ref 6.5–8.1)

## 2021-03-02 LAB — PROTIME-INR
INR: 0.9 (ref 0.8–1.2)
Prothrombin Time: 12.6 seconds (ref 11.4–15.2)

## 2021-03-02 LAB — RAPID URINE DRUG SCREEN, HOSP PERFORMED
Amphetamines: NOT DETECTED
Barbiturates: NOT DETECTED
Benzodiazepines: NOT DETECTED
Cocaine: NOT DETECTED
Opiates: NOT DETECTED
Tetrahydrocannabinol: NOT DETECTED

## 2021-03-02 LAB — RESP PANEL BY RT-PCR (FLU A&B, COVID) ARPGX2
Influenza A by PCR: NEGATIVE
Influenza B by PCR: NEGATIVE
SARS Coronavirus 2 by RT PCR: NEGATIVE

## 2021-03-02 LAB — URINALYSIS, ROUTINE W REFLEX MICROSCOPIC
Bilirubin Urine: NEGATIVE
Glucose, UA: NEGATIVE mg/dL
Ketones, ur: 15 mg/dL — AB
Leukocytes,Ua: NEGATIVE
Nitrite: NEGATIVE
Protein, ur: NEGATIVE mg/dL
Specific Gravity, Urine: 1.008 (ref 1.005–1.030)
pH: 7 (ref 5.0–8.0)

## 2021-03-02 LAB — T4, FREE: Free T4: 1.06 ng/dL (ref 0.61–1.12)

## 2021-03-02 LAB — TSH: TSH: 1.753 u[IU]/mL (ref 0.350–4.500)

## 2021-03-02 LAB — ETHANOL: Alcohol, Ethyl (B): <10 mg/dL (ref <10)

## 2021-03-02 LAB — APTT: aPTT: 26 seconds (ref 24–36)

## 2021-03-02 MED ORDER — ASPIRIN EC 81 MG PO TBEC
81.0000 mg | DELAYED_RELEASE_TABLET | Freq: Every day | ORAL | Status: DC
  Administered 2021-03-02 – 2021-03-03 (×2): 81 mg via ORAL
  Filled 2021-03-02 (×2): qty 1

## 2021-03-02 MED ORDER — DILTIAZEM HCL ER COATED BEADS 180 MG PO CP24
180.0000 mg | ORAL_CAPSULE | Freq: Every day | ORAL | Status: DC
  Administered 2021-03-03: 180 mg via ORAL
  Filled 2021-03-02: qty 1

## 2021-03-02 MED ORDER — SENNOSIDES-DOCUSATE SODIUM 8.6-50 MG PO TABS: 1.0000 | ORAL_TABLET | Freq: Every evening | ORAL | Status: DC | PRN

## 2021-03-02 MED ORDER — ACETAMINOPHEN 325 MG PO TABS: 650.0000 mg | ORAL_TABLET | ORAL | Status: DC | PRN

## 2021-03-02 MED ORDER — ADULT MULTIVITAMIN W/MINERALS CH
1.0000 | ORAL_TABLET | Freq: Every day | ORAL | Status: DC
  Administered 2021-03-02 – 2021-03-03 (×2): 1 via ORAL
  Filled 2021-03-02 (×2): qty 1

## 2021-03-02 MED ORDER — UMECLIDINIUM-VILANTEROL 62.5-25 MCG/INH IN AEPB
1.0000 | INHALATION_SPRAY | Freq: Every day | RESPIRATORY_TRACT | Status: DC
  Administered 2021-03-03: 1 via RESPIRATORY_TRACT
  Filled 2021-03-02: qty 14

## 2021-03-02 MED ORDER — ENOXAPARIN SODIUM 40 MG/0.4ML IJ SOSY
40.0000 mg | PREFILLED_SYRINGE | INTRAMUSCULAR | Status: DC
  Administered 2021-03-02: 40 mg via SUBCUTANEOUS
  Filled 2021-03-02: qty 0.4

## 2021-03-02 MED ORDER — STROKE: EARLY STAGES OF RECOVERY BOOK
Freq: Once | Status: DC
  Filled 2021-03-02: qty 1

## 2021-03-02 MED ORDER — UMECLIDINIUM-VILANTEROL 62.5-25 MCG/INH IN AEPB: 1.0000 | INHALATION_SPRAY | Freq: Every day | RESPIRATORY_TRACT | Status: DC

## 2021-03-02 MED ORDER — ACETAMINOPHEN 160 MG/5ML PO SOLN: 650.0000 mg | ORAL | Status: DC | PRN

## 2021-03-02 MED ORDER — ACETAMINOPHEN 650 MG RE SUPP: 650.0000 mg | RECTAL | Status: DC | PRN

## 2021-03-02 MED ORDER — MAGNESIUM OXIDE -MG SUPPLEMENT 400 (240 MG) MG PO TABS
200.0000 mg | ORAL_TABLET | Freq: Every day | ORAL | Status: DC
  Administered 2021-03-02 – 2021-03-03 (×2): 200 mg via ORAL
  Filled 2021-03-02 (×2): qty 1

## 2021-03-02 NOTE — ED Triage Notes (Signed)
Patient here POV from Home with Left Arm Numbness.  Patient states this Numbness began approximately at 0530 this AM. No Neurological Deficits Noted besides Weaker Hand Grip on Left Side.   BIB Wheelchair. A&Ox4. GCS 15.

## 2021-03-02 NOTE — ED Notes (Signed)
MD Delo at Bedside.

## 2021-03-02 NOTE — Progress Notes (Signed)
PT Cancellation Note  Patient Details Name: Nichole Lawrence MRN: [id number] DOB: 1933-04-23   Cancelled Treatment:    Reason Eval/Treat Not Completed: Medical issues which prohibited therapy - awaiting neurology consult, will check back.   Stacie Glaze, PT DPT Acute Rehabilitation Services Pager [phone]  Office [phone]    Nichole Lawrence 03/02/2021, 2:06 PM

## 2021-03-02 NOTE — ED Notes (Signed)
Called doug at [facility] for transport to [facility] at 1125

## 2021-03-02 NOTE — Progress Notes (Signed)
Pt admitted, taken to MRI, no ss of distress noted, family at bedside. VS to be completed after MRI.

## 2021-03-02 NOTE — H&P (Addendum)
History and Physical    Nichole Lawrence BTD:[id number] DOB: 07/23/1933 DOA: 03/02/2021  PCP: Merrilee Seashore, MD (Confirm with patient/family/NH records and if not entered, this has to be entered at [facility] point of entry) Patient coming from: [facility]  I have personally briefly reviewed patient's old medical records in [facility]  Chief Complaint: Heaviness of left arm  HPI: Nichole Lawrence is a 87 y.o. female with medical history significant of paroxysmal SVT, remote history of breast cancer status post mastectomy, COPD, macular degeneration, presented with new onset of left arm weakness.  Symptoms started this morning, patient woke up feeling tired, she went to bathroom and noticed was not able to use left arm to turn on the light switch because of the weakness.  No numbness.  Denies any numbness or weakness of any of the other limbs.  She has chronic blurry vision secondary to macular degeneration and no eye symptoms this morning.  No headache.  At baseline, appears that she has had episodes of palpitations 2-3 times every week, when on each time, she will have sensation of palpitations for 3 to 5 minutes, and she had to sit down and let the episode pass.  But she said she did not have a particularly sensation of palpitation this morning, no lightheadedness.  She occasionally has neck pain associated with turning of head, but no particular worsening of neck symptoms recently.  ED Course: Left-sided arm weakness noticed however improved during ER stay, and was considered non-tPA protocol.  CT negative for acute findings.  Review of Systems: As per HPI otherwise 14 point review of systems negative.    Past Medical History:  Diagnosis Date   Breast cancer ([place])    SVT (supraventricular tachycardia) ([place] [facility])     Past Surgical History:  Procedure Laterality Date   BACK SURGERY     MASTECTOMY Right    TONSILLECTOMY     TUBAL LIGATION       reports that she is a non-smoker but has  been exposed to tobacco smoke. She has never used smokeless tobacco. She reports current alcohol use. She reports that she does not use drugs.  Allergies  Allergen Reactions   Demerol [Meperidine] Nausea And Vomiting   Nsaids Rash    Family History  Problem Relation Age of Onset   Heart disease Mother    Emphysema Mother        never smoked   Rheum arthritis Mother    Breast cancer Mother    Lung cancer Father    CAD Brother      Prior to Admission medications   Medication Sig Start Date End Date Taking? Authorizing Provider  aspirin 81 MG tablet Take 81 mg by mouth daily.   Yes [provider]  diltiazem (CARDIZEM CD) 180 MG 24 hr capsule Take 1 capsule (180 mg total) by mouth daily. 11/05/20  Yes Lelon Perla, MD  MAGNESIUM SULFATE PO Take 250 mg by mouth daily.   Yes [provider]  Multiple Vitamin (MULTIVITAMIN) capsule Take 1 capsule by mouth daily.   Yes [provider]  umeclidinium-vilanterol (ANORO ELLIPTA) 62.5-25 MCG/INH AEPB Inhale 1 puff into the lungs daily. 03/16/20  Yes Mannam, Hart Robinsons, MD  Respiratory Therapy Supplies (FLUTTER) DEVI Three times a day for cough and congestion Patient not taking: Reported on 03/02/2021 12/20/17   Melvenia Needles, NP    Physical Exam: Vitals:   03/02/21 1100 03/02/21 1129 03/02/21 1130 03/02/21 1200  BP:  132/62  Pulse: 74 79 79 74  Resp: (!) 22 18 (!) 28 (!) 26  Temp:      TempSrc:      SpO2: 95% 97% 97% 97%  Weight:      Height:        Constitutional: NAD, calm, comfortable Vitals:   03/02/21 1100 03/02/21 1129 03/02/21 1130 03/02/21 1200  BP:    132/62  Pulse: 74 79 79 74  Resp: (!) 22 18 (!) 28 (!) 26  Temp:      TempSrc:      SpO2: 95% 97% 97% 97%  Weight:      Height:       Eyes: PERRL, lids and conjunctivae normal ENMT: Mucous membranes are moist. Posterior pharynx clear of any exudate or lesions.Normal dentition.  Neck: normal, supple, no masses, no  thyromegaly Respiratory: clear to auscultation bilaterally, no wheezing, no crackles. Normal respiratory effort. No accessory muscle use.  Cardiovascular: Regular rate and rhythm, no murmurs / rubs / gallops. No extremity edema. 2+ pedal pulses. No carotid bruits.  Abdomen: no tenderness, no masses palpated. No hepatosplenomegaly. Bowel sounds positive.  Musculoskeletal: no clubbing / cyanosis. No joint deformity upper and lower extremities. Good ROM, no contractures. Normal muscle tone.  Skin: no rashes, lesions, ulcers. No induration Neurologic: CN 2-12 grossly intact. Sensation intact, DTR normal. Strength 5/5 in all 4.  Psychiatric: Normal judgment and insight. Alert and oriented x 3. Normal mood.     Labs on Admission: I have personally reviewed following labs and imaging studies  CBC: Recent Labs  Lab 03/02/21 0708  WBC 4.6  NEUTROABS 2.5  HGB 15.7*  HCT 46.6*  MCV 92.3  PLT 782   Basic Metabolic Panel: Recent Labs  Lab 03/02/21 0708  NA 135  K 4.3  CL 98  CO2 27  GLUCOSE 105*  BUN 11  CREATININE 0.68  CALCIUM 10.1   GFR: Estimated Creatinine Clearance: 48.8 mL/min (by C-G formula based on SCr of 0.68 mg/dL). Liver Function Tests: Recent Labs  Lab 03/02/21 0708  AST 24  ALT 16  ALKPHOS 112  BILITOT 1.0  PROT 7.1  ALBUMIN 4.5   No results for input(s): LIPASE, AMYLASE in the last 168 hours. No results for input(s): AMMONIA in the last 168 hours. Coagulation Profile: Recent Labs  Lab 03/02/21 0708  INR 0.9   Cardiac Enzymes: No results for input(s): CKTOTAL, CKMB, CKMBINDEX, TROPONINI in the last 168 hours. BNP (last 3 results) No results for input(s): PROBNP in the last 8760 hours. HbA1C: No results for input(s): HGBA1C in the last 72 hours. CBG: No results for input(s): GLUCAP in the last 168 hours. Lipid Profile: No results for input(s): CHOL, HDL, LDLCALC, TRIG, CHOLHDL, LDLDIRECT in the last 72 hours. Thyroid Function Tests: No results for  input(s): TSH, T4TOTAL, FREET4, T3FREE, THYROIDAB in the last 72 hours. Anemia Panel: No results for input(s): VITAMINB12, FOLATE, FERRITIN, TIBC, IRON, RETICCTPCT in the last 72 hours. Urine analysis:    Component Value Date/Time   COLORURINE COLORLESS (A) 03/02/2021 0708   APPEARANCEUR CLEAR 03/02/2021 0708   LABSPEC 1.008 03/02/2021 0708   PHURINE 7.0 03/02/2021 0708   GLUCOSEU NEGATIVE 03/02/2021 0708   HGBUR TRACE (A) 03/02/2021 0708   BILIRUBINUR NEGATIVE 03/02/2021 0708   KETONESUR 15 (A) 03/02/2021 0708   PROTEINUR NEGATIVE 03/02/2021 0708   NITRITE NEGATIVE 03/02/2021 0708   LEUKOCYTESUR NEGATIVE 03/02/2021 0708    Radiological Exams on Admission: CT HEAD WO CONTRAST  Result  Date: 03/02/2021 CLINICAL DATA:  Neuro deficit, acute, stroke suspected. Additional provided: Left arm numbness. EXAM: CT HEAD WITHOUT CONTRAST TECHNIQUE: Contiguous axial images were obtained from the base of the skull through the vertex without intravenous contrast. COMPARISON:  Head CT 03/19/2016. FINDINGS: Brain: Mild generalized cerebral atrophy. Patchy and ill-defined hypoattenuation within the cerebral white matter, nonspecific but compatible chronic small vessel ischemic disease. Small chronic infarct within the right cerebellar hemisphere, new from the prior examination of 03/19/2016. There is no acute intracranial hemorrhage. No demarcated cortical infarct. No extra-axial fluid collection. No evidence of an intracranial mass. No midline shift. Vascular: No hyperdense vessel.  Atherosclerotic calcifications. Skull: Normal. Negative for fracture or focal lesion. Sinuses/Orbits: Visualized orbits show no acute finding. Small left frontal sinus mucous retention cyst. IMPRESSION: No evidence of acute intracranial abnormality. Chronic small-vessel ischemic changes within the cerebral white matter. Small chronic infarct within the right cerebellar hemisphere, new as compared to the head CT of 03/19/2016. Mild  generalized cerebral atrophy. Small left frontal sinus mucous retention cyst. Electronically Signed   By: Kellie Simmering DO   On: 03/02/2021 08:03    EKG: Independently reviewed. A-flutter with 1:3 transduction block, rate dependent LBBB.  Assessment/Plan Active Problems:   Paresthesia of left upper and lower extremity   TIA (transient ischemic attack)  (please populate well all problems here in Problem List. (For example, if patient is on BP meds at home and you resume or decide to hold them, it is a problem that needs to be her. Same for CAD, COPD, HLD and so on)  TIA -EKG this morning showed a flutter.  Now appears to be in sinus rhythm again.  Suspect TIA related to paroxysmal a flutter. -Systemic anticoagulation indicated, we will see what MRI shows, if no significant bleeding or large stroke, will start anticoagulation during this admission. -Low permissive hypertension for 2 to 3 days -MRI, MRA. -Echocardiogram.  Paroxysmal A. Fib/flutter -Back to sinus, seems to be poorly controlled, especially in the morning. -Continue Cardizem. -Thyroid functions. -CHADS2=3, systemic anticoagulation indicated. -Initial EKG at 7:27 showed a flutter with 1: 3 transduction block and appears to be rate dependent partial LBBB as well, then repeat EKG at 8:20, back to sinus rhythm and LBBB also resolved.  Patient denied any chest pain this morning.  We will check Trop level.  Question of HLD -According to her cardiologist note, patient declined statin in the past.  We will check lipid panel.  Discussed with patient at bedside regarding hyperlipidemia, patient seems to be unaware of such diagnosis in her past.  COPD -Stable, no symptoms signs of acute exacerbation  Macular degeneration -Monthly ophthalmologist follow-up.  DVT prophylaxis: Lovenox Code Status: Full Code Family Communication: None at bedside Disposition Plan: Expect less than 2 midnight hospital stay. Consults called:  Neurology Admission status: Tele observation.   Lequita Halt MD [facility] Pager [phone]  03/02/2021, 2:10 PM

## 2021-03-02 NOTE — Consult Note (Signed)
NEUROLOGY CONSULTATION NOTE   Date of service: March 02, 2021 Patient Name: Nichole Lawrence MRN:  [id number] DOB:  1933-04-20 Reason for consult: "left arm heaviness" Requesting Provider: Lequita Halt, MD _ _ _   _ __   _ __ _ _  __ __   _ __   __ _  History of Present Illness  Nichole Lawrence is a 87 y.o. female with PMH significant for Paroxysmal SVT, COPD, macular degeneration who presents with new onset left arm weakness.  She got up to use the bathroom at 0200 on 03/01/21 and was fine. She wokeup at 0530 and felt a strange feeling in her chest and upper body.  She got up to use the bathroom and felt it was really difficult to turn the light switch on with her left hand.  She came back from the bathroom and sat down on her bed and still felt that her left arm was heavy.  She got off the bed, made some coffee and called her daughter and they came to the ED. The left arm weakness lasted a couple hours before it got better.  mRS:1 tPA/Thrombectomy: no LVO, outside the window. NIHSS components Score: Comment  1a Level of Conscious 0[x]  1[]  2[]  3[]      1b LOC Questions 0[x]  1[]  2[]       1c LOC Commands 0[x]  1[]  2[]       2 Best Gaze 0[x]  1[]  2[]       3 Visual 0[x]  1[]  2[]  3[]      4 Facial Palsy 0[x]  1[]  2[]  3[]      5a Motor Arm - left 0[x]  1[]  2[]  3[]  4[]  UN[]    5b Motor Arm - Right 0[x]  1[]  2[]  3[]  4[]  UN[]    6a Motor Leg - Left 0[x]  1[]  2[]  3[]  4[]  UN[]    6b Motor Leg - Right 0[x]  1[]  2[]  3[]  4[]  UN[]    7 Limb Ataxia 0[x]  1[]  2[]  3[]  UN[]     8 Sensory 0[x]  1[]  2[]  UN[]      9 Best Language 0[x]  1[]  2[]  3[]      10 Dysarthria 0[x]  1[]  2[]  UN[]      11 Extinct. and Inattention 0[x]  1[]  2[]       TOTAL: 0      ROS   Constitutional Denies weight loss, fever and chills.   HEENT Denies changes in vision and hearing.   Respiratory Denies SOB and cough.   CV Denies palpitations and CP   GI Denies abdominal pain, nausea, vomiting and diarrhea.   GU Denies dysuria and urinary frequency.    MSK Denies myalgia and joint pain.   Skin Denies rash and pruritus.   Neurological Denies headache and syncope.   Psychiatric Denies recent changes in mood. Denies anxiety and depression.    Past History   Past Medical History:  Diagnosis Date   Breast cancer ([place])    SVT (supraventricular tachycardia) ([place])    Past Surgical History:  Procedure Laterality Date   BACK SURGERY     MASTECTOMY Right    TONSILLECTOMY     TUBAL LIGATION     Family History  Problem Relation Age of Onset   Heart disease Mother    Emphysema Mother        never smoked   Rheum arthritis Mother    Breast cancer Mother    Lung cancer Father    CAD Brother    Social History   Socioeconomic History   Marital status: Widowed    Spouse  name: Not on file   Number of children: 2   Years of education: Not on file   Highest education level: Not on file  Occupational History   Occupation: Retired Therapist, sports  Tobacco Use   Smoking status: Passive Smoke Exposure - Never Smoker   Smokeless tobacco: Never   Tobacco comments:    Husband and Father   Vaping Use   Vaping Use: Never used  Substance and Sexual Activity   Alcohol use: Yes    Alcohol/week: 0.0 standard drinks    Comment: Occasional   Drug use: No   Sexual activity: Not on file  Other Topics Concern   Not on file  Social History Narrative   [facility] (05/11/17):   Originally from [place]. [place]. Moved to [place] in 2006. Primarily worked as an Therapist, sports in the E.D., PACU, and in nursery. No known TB exposure. No mold, bird, or hot tub exposure. Remotely had a swimming pool. Enjoys exercising and walking. She does have a dog and enjoys gardening.    Social Determinants of Health   Financial Resource Strain: Not on file  Food Insecurity: Not on file  Transportation Needs: Not on file  Physical Activity: Not on file  Stress: Not on file  Social Connections: Not on file   Allergies  Allergen Reactions   Demerol [Meperidine] Nausea  And Vomiting   Nsaids Rash    Medications   Medications Prior to Admission  Medication Sig Dispense Refill Last Dose   aspirin 81 MG tablet Take 81 mg by mouth daily.   03/01/2021   diltiazem (CARDIZEM CD) 180 MG 24 hr capsule Take 1 capsule (180 mg total) by mouth daily. 90 capsule 3 03/02/2021   MAGNESIUM SULFATE PO Take 250 mg by mouth daily.   03/01/2021   Multiple Vitamin (MULTIVITAMIN) capsule Take 1 capsule by mouth daily.   03/01/2021   umeclidinium-vilanterol (ANORO ELLIPTA) 62.5-25 MCG/INH AEPB Inhale 1 puff into the lungs daily. 60 each 11 03/02/2021   Respiratory Therapy Supplies (FLUTTER) DEVI Three times a day for cough and congestion (Patient not taking: Reported on 03/02/2021) 1 each 0 Not Taking     Vitals   Vitals:   03/02/21 1200 03/02/21 1500 03/02/21 1700 03/02/21 1734  BP: 132/62   (!) 145/74  Pulse: 74   69  Resp: (!) 26 20 20 20   Temp:    97.9 F (36.6 C)  TempSrc:    Oral  SpO2: 97%   94%  Weight:      Height:         Body mass index is 20.53 kg/m.  Physical Exam   General: Laying comfortably in bed; in no acute distress.  HENT: Normal oropharynx and mucosa. Normal external appearance of ears and nose.  Neck: Supple, no pain or tenderness  CV: No JVD. No peripheral edema.  Pulmonary: Symmetric Chest rise. Normal respiratory effort.  Abdomen: Soft to touch, non-tender.  Ext: No cyanosis, edema, or deformity  Skin: No rash. Normal palpation of skin.   Musculoskeletal: Normal digits and nails by inspection. No clubbing.   Neurologic Examination  Mental status/Cognition: Alert, oriented to self, place, month and year, good attention.  Speech/language: Fluent, comprehension intact, object naming intact, repetition intact.  Cranial nerves:   CN II Pupils equal and reactive to light, no VF deficits    CN III,IV,VI EOM intact, no gaze preference or deviation, no nystagmus    CN V normal sensation in V1, V2, and  V3 segments bilaterally    CN VII no  asymmetry, no nasolabial fold flattening    CN VIII normal hearing to speech    CN IX & X normal palatal elevation, no uvular deviation    CN XI 5/5 head turn and 5/5 shoulder shrug bilaterally    CN XII midline tongue protrusion    Motor:  Muscle bulk: poor, tone normal, pronator drift mild LUE pronator drift. tremor none Mvmt Root Nerve  Muscle Right Left Comments  SA C5/6 Ax Deltoid 5 5   EF C5/6 Mc Biceps 5 5   EE C6/7/8 Rad Triceps 5 4+   WF C6/7 Med FCR     WE C7/8 PIN ECU     F Ab C8/T1 U ADM/FDI 5 4+   HF L1/2/3 Fem Illopsoas 5 5   KE L2/3/4 Fem Quad 5 5   DF L4/5 D Peron Tib Ant 5 5   PF S1/2 Tibial Grc/Sol 5 5    Reflexes:  Right Left Comments  Pectoralis      Biceps (C5/6) 2 2   Brachioradialis (C5/6) 2 2    Triceps (C6/7) 2 2    Patellar (L3/4) 1 1    Achilles (S1) 0 0    Hoffman      Plantar     Jaw jerk    Sensation:  Light touch intact   Pin prick    Temperature    Vibration   Proprioception    Coordination/Complex Motor:  - Finger to Nose intact BL - Heel to shin intact BL - Rapid alternating movement is normal - Gait: deferred.  Labs   CBC:  Recent Labs  Lab 03/02/21 0708  WBC 4.6  NEUTROABS 2.5  HGB 15.7*  HCT 46.6*  MCV 92.3  PLT 967    Basic Metabolic Panel:  Lab Results  Component Value Date   NA 135 03/02/2021   K 4.3 03/02/2021   CO2 27 03/02/2021   GLUCOSE 105 (H) 03/02/2021   BUN 11 03/02/2021   CREATININE 0.68 03/02/2021   CALCIUM 10.1 03/02/2021   GFRNONAA >60 03/02/2021   GFRAA >60 03/19/2016   Lipid Panel: No results found for: LDLCALC HgbA1c: No results found for: HGBA1C Urine Drug Screen:     Component Value Date/Time   LABOPIA NONE DETECTED 03/02/2021 0708   COCAINSCRNUR NONE DETECTED 03/02/2021 0708   LABBENZ NONE DETECTED 03/02/2021 0708   AMPHETMU NONE DETECTED 03/02/2021 0708   THCU NONE DETECTED 03/02/2021 0708   LABBARB NONE DETECTED 03/02/2021 0708    Alcohol Level     Component Value  Date/Time   ETH <10 03/02/2021 0729    CT Head without contrast(personally reviewed): CTH was negative for a large hypodensity concerning for a large territory infarct or hyperdensity concerning for an ICH  MR Angio head without contrast and MR angio neck(personally reivewed): No LVO  MRI Brain(personally reviewed): 1. Punctate acute infarct in the posterior left parietal cortex with suspected additional punctate acute infarct in the left occipital lobe. These could be embolic in etiology. 2. Remote bilateral cerebellar lacunar infarcts and moderate chronic microvascular ischemic disease.   Impression   Nichole Lawrence is a 87 y.o. female with PMH significant for Paroxysmal SVT, COPD, macular degeneration who presents with new onset left arm weakness that improved. Her neurologic examination is notable for mild LUE weakness. MRI Brain with punctate posterior left parietal cortex infarct along with suspected additional left occipital punctate infarct. I do not think that these  infarcts explain her symptoms. I suspect that she might have an additional small lacune on the right or in the brainstem that could be causing her symptoms. Unlikely for spinal cord lesion to present with isolated motor symptoms and no sensory deficit.  Primary Diagnosis:  Other cerebral infarction due to occlusion of stenosis of small artery.  Recommendations  Plan:  - Frequent Neuro checks per stroke unit protocol - Recommend obtaining TTE - Recommend obtaining Lipid panel with LDL - Please start statin if LDL > 70 - Recommend HbA1c - Antithrombotic - aspirin 81mg  daily along with plavix 75mg  daily for 21 days, followed by aspirin 81mg  daily alone. - Recommend DVT ppx - SBP goal - permissive hypertension first 24 h < 220/110. Hold home meds.  - Recommend Telemetry monitoring for arrythmia - Recommend bedside swallow screen prior to PO intake. - Stroke education booklet - Recommend PT/OT/SLP  consult  ______________________________________________________________  Thank you for the opportunity to take part in the care of this patient. If you have any further questions, please contact the neurology consultation attending.  Signed,  [place] Pager Number [phone] _ _ _   _ __   _ __ _ _  __ __   _ __   __ _

## 2021-03-02 NOTE — ED Provider Notes (Signed)
[facility] EMERGENCY DEPT Provider Note   CSN: [id number] Arrival date & time: 03/02/21  0640     History Chief Complaint  Patient presents with   Weakness    Left Arm    Nichole Lawrence is a 87 y.o. female.  Patient is a 87 year old female with a history of prior breast cancer, SVT, emphysema, and allergic rhinitis who presents with left arm weakness and numbness.  She said that she woke up at 530 and felt a little off.  She felt a little off balance when she was walking and noticed that she had a hard time lifting her arm up to turn the light switch on.  She also has some difficulty with handgrip on the left.  She feels little bit of numbness on the left arm.  She also has some numbness in her left leg.  Her last known normal was around 2 AM this morning when she got up to go the bathroom and was not having any symptoms at that time.  No prior history of similar symptoms.  No vision deficits.  No speech changes.      Past Medical History:  Diagnosis Date   Breast cancer [facility])    SVT (supraventricular tachycardia) [facility])     Patient Active Problem List   Diagnosis Date Noted   Paresthesia of left upper and lower extremity 03/02/2021   Bronchiectasis ([place]) 12/21/2017   Other emphysema ([place]) 12/11/2017   Pulmonary nodule 12/11/2017   Seasonal allergic rhinitis 05/11/2017   Arthritis 05/11/2017   Dyspnea on exertion 07/28/2015   Pulmonary infiltrates on CXR 07/28/2015   Chest pain 08/28/2014   SVT (supraventricular tachycardia) ([place]) 08/28/2014    Past Surgical History:  Procedure Laterality Date   BACK SURGERY     MASTECTOMY Right    TONSILLECTOMY     TUBAL LIGATION       OB History   No obstetric history on file.     Family History  Problem Relation Age of Onset   Heart disease Mother    Emphysema Mother        never smoked   Rheum arthritis Mother    Breast cancer Mother    Lung cancer Father    CAD Brother     Social History    Tobacco Use   Smoking status: Passive Smoke Exposure - Never Smoker   Smokeless tobacco: Never   Tobacco comments:    Husband and Father   Vaping Use   Vaping Use: Never used  Substance Use Topics   Alcohol use: Yes    Alcohol/week: 0.0 standard drinks    Comment: Occasional   Drug use: No    Home Medications Prior to Admission medications   Medication Sig Start Date End Date Taking? Authorizing Provider  aspirin 81 MG tablet Take 81 mg by mouth daily.    [provider]  diltiazem (CARDIZEM CD) 180 MG 24 hr capsule Take 1 capsule (180 mg total) by mouth daily. 11/05/20   Lelon Perla, MD  MAGNESIUM SULFATE PO Take 250 mg by mouth daily.    [provider]  Multiple Vitamin (MULTIVITAMIN) capsule Take 1 capsule by mouth daily.    [provider]  Respiratory Therapy Supplies (FLUTTER) DEVI Three times a day for cough and congestion 12/20/17   Parrett, Tammy S, NP  umeclidinium-vilanterol (ANORO ELLIPTA) 62.5-25 MCG/INH AEPB Inhale 1 puff into the lungs daily. 03/16/20   [name], MD    Allergies  Demerol [meperidine] and Nsaids  Review of Systems   Review of Systems  Constitutional:  Negative for chills, diaphoresis, fatigue and fever.  HENT:  Negative for congestion, rhinorrhea and sneezing.   Eyes: Negative.   Respiratory:  Negative for cough, chest tightness and shortness of breath.   Cardiovascular:  Negative for chest pain and leg swelling.  Gastrointestinal:  Negative for abdominal pain, blood in stool, diarrhea, nausea and vomiting.  Genitourinary:  Negative for difficulty urinating, flank pain, frequency and hematuria.  Musculoskeletal:  Negative for arthralgias and back pain.  Skin:  Negative for rash.  Neurological:  Positive for weakness and numbness. Negative for dizziness, speech difficulty and headaches.   Physical Exam Updated Vital Signs BP 129/62   Pulse 75   Temp (!) 97.5 F (36.4 C) (Oral)   Resp 19   Ht 5\' 8"   (1.727 m)   Wt 61.2 kg   SpO2 96%   BMI 20.53 kg/m   Physical Exam Constitutional:      Appearance: She is well-developed.  HENT:     Head: Normocephalic and atraumatic.  Eyes:     Pupils: Pupils are equal, round, and reactive to light.  Cardiovascular:     Rate and Rhythm: Normal rate and regular rhythm.     Heart sounds: Normal heart sounds.  Pulmonary:     Effort: Pulmonary effort is normal. No respiratory distress.     Breath sounds: Normal breath sounds. No wheezing or rales.  Chest:     Chest wall: No tenderness.  Abdominal:     General: Bowel sounds are normal.     Palpations: Abdomen is soft.     Tenderness: There is no abdominal tenderness. There is no guarding or rebound.  Musculoskeletal:        General: Normal range of motion.     Cervical back: Normal range of motion and neck supple.  Lymphadenopathy:     Cervical: No cervical adenopathy.  Skin:    General: Skin is warm and dry.     Findings: No rash.  Neurological:     Mental Status: She is alert and oriented to person, place, and time.     Comments: She has a very slight pronator drift on the left as compared to the right.  She has some slight weakness, 4 out of 5 on the left upper extremity as compared to the right.  She has normal strength in the lower extremities.  She has some slight decrease in sensation to the left upper extremity.  She has some slight decrease sensation to the left lower extremity.  Sensation to the right extremities are intact.  Cranial nerves II through XII grossly intact.  Finger-to-nose intact.  Visual fields full to confrontation.    ED Results / Procedures / Treatments   Labs (all labs ordered are listed, but only abnormal results are displayed) Labs Reviewed  CBC - Abnormal; Notable for the following components:      Result Value   Hemoglobin 15.7 (*)    HCT 46.6 (*)    All other components within normal limits  COMPREHENSIVE METABOLIC PANEL - Abnormal; Notable for the  following components:   Glucose, Bld 105 (*)    All other components within normal limits  URINALYSIS, ROUTINE W REFLEX MICROSCOPIC - Abnormal; Notable for the following components:   Color, Urine COLORLESS (*)    Hgb urine dipstick TRACE (*)    Ketones, ur 15 (*)    All other components within normal  limits  RESP PANEL BY RT-PCR (FLU A&B, COVID) [id number]  PROTIME-INR  APTT  DIFFERENTIAL  ETHANOL  RAPID URINE DRUG SCREEN, HOSP PERFORMED    EKG EKG Interpretation  Date/Time:  Tuesday March 02 2021 08:20:38 EDT Ventricular Rate:  74 PR Interval:  298 QRS Duration: 89 QT Interval:  389 QTC Calculation: 432 R Axis:   -54 Text Interpretation: Sinus rhythm Prolonged PR interval Probable left atrial enlargement Left anterior fascicular block Anterior infarct, old Confirmed by Malvin Johns [id number]) on 03/02/2021 8:22:38 AM  Radiology CT HEAD WO CONTRAST  Result Date: 03/02/2021 CLINICAL DATA:  Neuro deficit, acute, stroke suspected. Additional provided: Left arm numbness. EXAM: CT HEAD WITHOUT CONTRAST TECHNIQUE: Contiguous axial images were obtained from the base of the skull through the vertex without intravenous contrast. COMPARISON:  Head CT 03/19/2016. FINDINGS: Brain: Mild generalized cerebral atrophy. Patchy and ill-defined hypoattenuation within the cerebral white matter, nonspecific but compatible chronic small vessel ischemic disease. Small chronic infarct within the right cerebellar hemisphere, new from the prior examination of 03/19/2016. There is no acute intracranial hemorrhage. No demarcated cortical infarct. No extra-axial fluid collection. No evidence of an intracranial mass. No midline shift. Vascular: No hyperdense vessel.  Atherosclerotic calcifications. Skull: Normal. Negative for fracture or focal lesion. Sinuses/Orbits: Visualized orbits show no acute finding. Small left frontal sinus mucous retention cyst. IMPRESSION: No evidence of acute intracranial abnormality. Chronic  small-vessel ischemic changes within the cerebral white matter. Small chronic infarct within the right cerebellar hemisphere, new as compared to the head CT of 03/19/2016. Mild generalized cerebral atrophy. Small left frontal sinus mucous retention cyst. Electronically Signed   By: Kellie Simmering DO   On: 03/02/2021 08:03    Procedures Procedures   Medications Ordered in ED Medications - No data to display  ED Course  I have reviewed the triage vital signs and the nursing notes.  Pertinent labs & imaging results that were available during my care of the patient were reviewed by me and considered in my medical decision making (see chart for details).    MDM Rules/Calculators/A&P                          Patient is a 87 year old female who presents with left arm numbness and weakness and some left leg numbness.  She has no evidence on clinical exam of an LVO.  Her symptoms are mild.  She is out of the window for tPA.  Her head CT shows no acute abnormality.  There is evidence of an old infarct.  She initially was in a flutter on arrival but this converted to a sinus rhythm.  She has no known history of a flutter.  She has a reported history of SVT.  She is not on anticoagulants.  Her labs are nonconcerning.  I spoke with Dr. Tamala Julian with the hospitalist service who will admit the patient for further treatment.  I also notified Dr. Quinn Axe with neurology who will consult on the patient when she is at [facility]. Final Clinical Impression(s) / ED Diagnoses Final diagnoses:  Cerebrovascular accident (CVA), unspecified mechanism [facility])    Rx / [place] Orders ED Discharge Orders     None        Malvin Johns, MD 03/02/21 [phone]

## 2021-03-03 ENCOUNTER — Other Ambulatory Visit: Payer: Self-pay | Admitting: Student

## 2021-03-03 ENCOUNTER — Other Ambulatory Visit (HOSPITAL_COMMUNITY): Payer: Self-pay

## 2021-03-03 ENCOUNTER — Observation Stay (HOSPITAL_BASED_OUTPATIENT_CLINIC_OR_DEPARTMENT_OTHER): Payer: Medicare Other

## 2021-03-03 DIAGNOSIS — I6389 Other cerebral infarction: Secondary | ICD-10-CM | POA: Diagnosis not present

## 2021-03-03 DIAGNOSIS — E785 Hyperlipidemia, unspecified: Secondary | ICD-10-CM

## 2021-03-03 DIAGNOSIS — I480 Paroxysmal atrial fibrillation: Secondary | ICD-10-CM

## 2021-03-03 DIAGNOSIS — G459 Transient cerebral ischemic attack, unspecified: Secondary | ICD-10-CM

## 2021-03-03 DIAGNOSIS — I639 Cerebral infarction, unspecified: Secondary | ICD-10-CM | POA: Diagnosis not present

## 2021-03-03 LAB — T3: T3, Total: 107 ng/dL (ref 71–180)

## 2021-03-03 LAB — ECHOCARDIOGRAM COMPLETE
Area-P 1/2: 5.13 cm2
Height: 68 in
P 1/2 time: 803 msec
S' Lateral: 3.1 cm
Weight: 2160 oz

## 2021-03-03 LAB — LIPID PANEL
Cholesterol: 185 mg/dL (ref 0–200)
HDL: 61 mg/dL (ref >40)
LDL Cholesterol: 116 mg/dL — ABNORMAL HIGH (ref 0–99)
Total CHOL/HDL Ratio: 3 RATIO
Triglycerides: 41 mg/dL (ref <150)
VLDL: 8 mg/dL (ref 0–40)

## 2021-03-03 LAB — HEMOGLOBIN A1C
Hgb A1c MFr Bld: 5.7 % — ABNORMAL HIGH (ref 4.8–5.6)
Mean Plasma Glucose: 116.89 mg/dL

## 2021-03-03 LAB — MAGNESIUM: Magnesium: 2.1 mg/dL (ref 1.7–2.4)

## 2021-03-03 MED ORDER — ATORVASTATIN CALCIUM 40 MG PO TABS: 40.0000 mg | ORAL_TABLET | Freq: Every day | ORAL | Status: DC

## 2021-03-03 MED ORDER — ATORVASTATIN CALCIUM 40 MG PO TABS
40.0000 mg | ORAL_TABLET | Freq: Every day | ORAL | 0 refills | Status: DC
  Filled 2021-03-03: qty 90, 90d supply, fill #0

## 2021-03-03 MED ORDER — RIVAROXABAN 15 MG PO TABS: 15.0000 mg | ORAL_TABLET | Freq: Every day | ORAL | Status: DC

## 2021-03-03 MED ORDER — RIVAROXABAN 15 MG PO TABS
15.0000 mg | ORAL_TABLET | Freq: Every day | ORAL | 2 refills | Status: DC
  Filled 2021-03-03: qty 30, 30d supply, fill #0

## 2021-03-03 NOTE — Discharge Instructions (Addendum)
Nichole Lawrence,  You were in the hospital with a stroke. This appears to likely be related to atrial fibrillation vs atrial flutter. You have been started on Xarelto to decrease your risk of future strokes. Please follow-up with your cardiologist and the neurologist. Please stop taking your aspirin.   Information on my medicine - XARELTO (Rivaroxaban)  This medication education was reviewed with me or my healthcare representative as part of my discharge preparation.    Why was Xarelto prescribed for you? Xarelto was prescribed for you to reduce the risk of a blood clot forming that can cause a stroke if you have a medical condition called atrial fibrillation (a type of irregular heartbeat).  What do you need to know about xarelto ? Take your Xarelto ONCE DAILY at the same time every day with your evening meal. If you have difficulty swallowing the tablet whole, you may crush it and mix in applesauce just prior to taking your dose.  Take Xarelto exactly as prescribed by your doctor and DO NOT stop taking Xarelto without talking to the doctor who prescribed the medication.  Stopping without other stroke prevention medication to take the place of Xarelto may increase your risk of developing a clot that causes a stroke.  Refill your prescription before you run out.  After discharge, you should have regular check-up appointments with your healthcare provider that is prescribing your Xarelto.  In the future your dose may need to be changed if your kidney function or weight changes by a significant amount.  What do you do if you miss a dose? If you are taking Xarelto ONCE DAILY and you miss a dose, take it as soon as you remember on the same day then continue your regularly scheduled once daily regimen the next day. Do not take two doses of Xarelto at the same time or on the same day.   Important Safety Information A possible side effect of Xarelto is bleeding. You should call your  healthcare provider right away if you experience any of the following: Bleeding from an injury or your nose that does not stop. Unusual colored urine (red or dark brown) or unusual colored stools (red or black). Unusual bruising for unknown reasons. A serious fall or if you hit your head (even if there is no bleeding).  Some medicines may interact with Xarelto and might increase your risk of bleeding while on Xarelto. To help avoid this, consult your healthcare provider or pharmacist prior to using any new prescription or non-prescription medications, including herbals, vitamins, non-steroidal anti-inflammatory drugs (NSAIDs) and supplements.  This website has more information on Xarelto: [email or web address]

## 2021-03-03 NOTE — TOC Benefit Eligibility Note (Signed)
Patient Teacher, English as a foreign language completed.    The patient is currently admitted and upon discharge could be taking Xarelto 15 mg.  The current 30 day co-pay is, $47.00.   The patient is insured through [place], [place] Patient Advocate Specialist [place] Team Direct Number: [phone]  Fax: [phone]

## 2021-03-03 NOTE — Evaluation (Signed)
Occupational Therapy Evaluation Patient Details Name: Nichole Lawrence MRN: [id number] DOB: 03/28/1933 Today's Date: 03/03/2021    History of Present Illness 87 y.o. female presenting to ED 7/19 with new onset LUE weakness. CT (-) for acute findings. MRI (+) punctate acute infarct in posterior L parietal cortex with suspected additional punctate acute infarct in L occipital lobe. Remote bilateral cerebellar lacunar infacts and moderate chronic microvascular ischemic disease  also noted on imaging. PMHx significant for paroxysmal SVT, breast cancer s/p mastectomy, COPD and macular degeneration.   Clinical Impression   PTA patient was living alone in a private residence and was grossly I with ADLs/IADLs without AD. Patient reports being active at baseline driving, walking 1 mile for exercise daily, attending the [facility] 2x weekly and a yoga class 1x weekly. Patient currently functioning at baseline demonstrating observed ADLs, ADL transfers and functional mobility with I. AROM, MMT, sensation, coordination and vision WFL (with exception of known macular degeneration at baseline). Patient does not demonstrate need for continued acute occupational therapy services with OT to sign off at this time.       Follow Up Recommendations  No OT follow up    Equipment Recommendations  None recommended by OT    Recommendations for Other Services       Precautions / Restrictions Precautions Precautions: None      Mobility Bed Mobility Overal bed mobility: Independent                  Transfers Overall transfer level: Independent                    Balance Overall balance assessment: No apparent balance deficits (not formally assessed)                                         ADL either performed or assessed with clinical judgement   ADL Overall ADL's : Independent                                       General ADL Comments: I with observed ADLs  and community distanec mobility.     Vision Baseline Vision/History: Macular Degeneration Patient Visual Report: No change from baseline Additional Comments: Reports moderate black spot in R eye. Receiving shots in eye for treatment.     Perception Perception Comments: [facility]   Praxis Praxis Praxis tested?: Within functional limits    Pertinent Vitals/Pain Pain Assessment: No/denies pain     Hand Dominance Right   Extremity/Trunk Assessment Upper Extremity Assessment Upper Extremity Assessment: Overall WFL for tasks assessed   Lower Extremity Assessment Lower Extremity Assessment: Defer to PT evaluation   Cervical / Trunk Assessment Cervical / Trunk Assessment: Normal   Communication Communication Communication: No difficulties   Cognition Arousal/Alertness: Awake/alert Behavior During Therapy: WFL for tasks assessed/performed Overall Cognitive Status: Within Functional Limits for tasks assessed                                     General Comments  Daughter present at bedside.    Exercises     Shoulder Instructions      Home Living Family/patient expects to be discharged to:: Private residence Living Arrangements: Alone Available Help  at Discharge: Family Type of Home: House Home Access: Stairs to enter [employer] of Steps: 1 STE from garage Entrance Stairs-Rails: None Home Layout: One level     Bathroom Shower/Tub: Occupational psychologist: [place]: Shower seat          Prior Functioning/Environment Level of Independence: Independent        Comments: I with ADLs/IADLs; goes to the [employer] 2x weekly; walks 1 mile daily with dog; drives        OT Problem List:        OT Treatment/Interventions:      OT Goals(Current goals can be found in the care plan section) Acute Rehab OT Goals Patient Stated Goal: To return home. OT Goal Formulation: With patient  OT Frequency:     Barriers to  D/C:            Co-evaluation              AM-PAC OT "6 Clicks" Daily Activity     Outcome Measure Help from another person eating meals?: None Help from another person taking care of personal grooming?: None Help from another person toileting, which includes using toliet, bedpan, or urinal?: None Help from another person bathing (including washing, rinsing, drying)?: None Help from another person to put on and taking off regular upper body clothing?: None Help from another person to put on and taking off regular lower body clothing?: None 6 Click Score: 24   End of Session Equipment Utilized During Treatment: Gait belt Nurse Communication: Mobility status;Other (comment) (Ok to be indepedent in room.)  Activity Tolerance: Patient tolerated treatment well Patient left: in chair;with call bell/phone within reach  OT Visit Diagnosis: Muscle weakness (generalized) (M62.81)                Time: 1829-9371 OT Time Calculation (min): 12 min Charges:  OT General Charges $OT Visit: 1 Visit OT Evaluation $OT Eval Low Complexity: 1 Low  Huan Pollok H. OTR/L Supplemental OT, Department of rehab services [phone]  Olly Shiner R H. 03/03/2021, 7:40 AM

## 2021-03-03 NOTE — Progress Notes (Addendum)
STROKE TEAM PROGRESS NOTE    Interval History   No acute events overnight, patient sitting up in chair with son at bedside.   She was updated regarding her stroke, concern for embolic source and that her EKG at OSH showed atrial flutter.   Cardiology to be consulted given atrial flutter.   Pertinent Lab Work and Imaging    03/02/21 CT Head WO IV Contrast No evidence of acute intracranial abnormality.   Chronic small-vessel ischemic changes within the cerebral white matter.   Small chronic infarct within the right cerebellar hemisphere, new as compared to the head CT of 03/19/2016.   Mild generalized cerebral atrophy.   Small left frontal sinus mucous retention cyst.  03/02/21 MR Angio Head and Neck  MRA head: No large vessel occlusion or proximal hemodynamically significant stenosis.   MRA Neck:  No visible significant (greater than 50%) stenosis.  03/02/21 MRI Brain WO IV Contrast 1. Punctate acute infarct in the posterior left parietal cortex with suspected additional punctate acute infarct in the left occipital lobe. These could be embolic in etiology. 2. Remote bilateral cerebellar lacunar infarcts and moderate chronic microvascular ischemic disease.  03/03/21 Echocardiogram Complete  Results pending   Physical Examination   Constitutional: Calm, appropriate for condition  Cardiovascular: Normal RR Respiratory: No increased WOB   Mental status: AAOx4, following commands  Speech: Fluent with repetition and naming intact  Cranial nerves: EOMI, VFF, Tongue midline, Shoulder shrug intact  Motor: Normal bulk and tone. No drift.   Dlt Bic Tri FgS Grp HF  KnF KnE PIF DoF  R 5 5 5 5 5 5 5 5 5 5   L 5 5 5 5 5 5 5 5 5 5    Sensory: Intact to light touch throughout  Coordination: Intact FNF  Gait: Steady   NIHSS: 0  Assessment and Plan   Nichole Lawrence is a 87 y.o. female w/pmh of paroxysmal SVT, COPD, macular degeneration who presents with new onset left arm  weakness. She did not receive IVTPA/thrombectomy due to being outside of the time window/lack of LVO.    #Punctate Left Occipital, Left Parietal and Right Frontal Stroke, Embolic in etiology Patient presented with the symptoms described above. At this time, stroke work up is essentially complete pending echo findings. MRI Brain showed punctate stroke in different vascular territories- right frontal, left parietal and left occipital. MRA Head and Neck did not show significant stenosis to anterior or posterior circulation. Stroke labs w/LDL 116 hemoglobin A1C 5.7. For stroke prevention Aspirin 81 mg was initiated. She was noted to have atrial flutter in the ED at OSH prior to tx to [facility]. Primary team asked to consult Cardiology for further work up/verification of atrial flutter. Given stroke in different vascular territories and atrial flutter noted in OSH stroke etiology is cardioembolic.  - If she truly has atrial flutter recommend to stop Aspirin 81 mg and start anticoagulation with Eliquis. Continue Aspirin in the interim until decision regarding anticoagulation is made  - Continue Atorvastatin 40 mg for secondary stroke prevention  - Referral placed to neurology for stroke follow up    #Permissive Hypertension  Recommend permissive hypertension for the first 24 to 28 hours after an acute stroke. Pressures are currently trending normotensive at this time.   #Hyperlipidemia From a stroke prevention stand point, the LDL goal is < 70. LDL is 116, Atorvastatin 40 mg started this admission for better LDL control.  #DMII  Hemoglobin A1C this admission noted to be  5.7 in the prediabetic range. Recommend outpatient PCP follow up for prediabetes and SSI while hospitalized.   Neurology will sign off, please reach out with any questions via Amion.   Hospital day # 0  Ruta Hinds, NP  [facility] Nurse Practitioner Patient seen and discussed with attending physician Dr. Leonie Man   I have  personally obtained history,examined this patient, reviewed notes, independently viewed imaging studies, participated in medical decision making and plan of care.ROS completed by me personally and pertinent positives fully documented  I have made any additions or clarifications directly to the above note. Agree with note above.  Patient presented with transient left upper extremity weakness with new onset atrial flutter on EKG at the outside hospital.  MRI scan shows tiny punctate right frontal as well as left parietal and occipital infarcts likely of embolic etiology.  Recommend cardiology consult to confirm a flutter and then likely anticoagulation with Eliquis.  Aggressive risk factor modification.  Mobilize out of bed.  Therapy consults.  Discussed with Dr. Lonny Prude.  Greater than 50% time during the 35-minute visit was spent on counseling and coordination of care and discussion with care team about atrial flutter and stroke prevention and answering questions  Antony Contras, MD Medical Director [place] Pager: [phone] 03/03/2021 3:11 PM   To contact Stroke Continuity provider, please refer to [email or web address] After hours, contact General Neurology

## 2021-03-03 NOTE — Discharge Summary (Signed)
Physician Discharge Summary  Nichole Lawrence ZOX:[id number] DOB: 02/04/1933 DOA: 03/02/2021  PCP: Merrilee Seashore, MD  Admit date: 03/02/2021 Discharge date: 03/03/2021  Admitted From: Home Disposition: Home  Recommendations for Outpatient Follow-up:  Follow up with PCP in 1 week Follow-up with neurology and cardiology as an outpatient. Please follow up on the following pending results: None  Discharge Condition: Stable CODE STATUS: Full code Diet recommendation: Regular diet   Brief/Interim Summary:  Admission HPI written by Lequita Halt, MD   HPI: MAKYRA CORPREW is a 87 y.o. female with medical history significant of paroxysmal SVT, remote history of breast cancer status post mastectomy, COPD, macular degeneration, presented with new onset of left arm weakness.   Symptoms started this morning, patient woke up feeling tired, she went to bathroom and noticed was not able to use left arm to turn on the light switch because of the weakness.  No numbness.  Denies any numbness or weakness of any of the other limbs.  She has chronic blurry vision secondary to macular degeneration and no eye symptoms this morning.  No headache.   At baseline, appears that she has had episodes of palpitations 2-3 times every week, when on each time, she will have sensation of palpitations for 3 to 5 minutes, and she had to sit down and let the episode pass.  But she said she did not have a particularly sensation of palpitation this morning, no lightheadedness.   She occasionally has neck pain associated with turning of head, but no particular worsening of neck symptoms recently.   Hospital course:  Acute stroke, likely embolic Left occipital, left parietal, right frontal. Patient with evidence of atrial fibrillation/flutter in the ED prior to transfer to [facility]. LDL of 116. Hemoglobin A1C of 5.7%. Patient was prescribed aspirin which was transitioned to Xarelto 15 mg  daily.  Primary hypertension Continue Cardizem.  Paroxysmal atrial fibrillation/flutter Continue Cardizem. Patient presented with atrial flutter on admission. Cardiology consulted and started patient on Xarelto 15 mg daily as mentioned above.  Hyperlipidemia Lipitor  Diabetes mellitus, type 2 Diet control  Discharge Diagnoses:  Active Problems:   Paresthesia of left upper and lower extremity   Cerebrovascular accident (CVA) [facility])    Discharge Instructions  Discharge Instructions     Ambulatory referral to Neurology   Complete by: As directed    An appointment is requested in approximately: 4-8 weeks   Increase activity slowly   Complete by: As directed       Allergies as of 03/03/2021       Reactions   Demerol [meperidine] Nausea And Vomiting   Nsaids Rash        Medication List     STOP taking these medications    aspirin 81 MG tablet       TAKE these medications    Anoro Ellipta 62.5-25 MCG/INH Aepb Generic drug: umeclidinium-vilanterol Inhale 1 puff into the lungs daily.   atorvastatin 40 MG tablet Commonly known as: LIPITOR Take 1 tablet (40 mg total) by mouth daily.   diltiazem 180 MG 24 hr capsule Commonly known as: CARDIZEM CD Take 1 capsule (180 mg total) by mouth daily.   Flutter Devi Three times a day for cough and congestion   MAGNESIUM SULFATE PO Take 250 mg by mouth daily.   multivitamin capsule Take 1 capsule by mouth daily.   Rivaroxaban 15 MG Tabs tablet Commonly known as: XARELTO Take 1 tablet (15 mg total) by mouth  daily.        Follow-up Information     Lendon Colonel, NP Follow up.   Specialties: Nurse Practitioner, Radiology, Cardiology Why: Hospital follow-up with Cardiology scheduled for 04/23/2021 at 3:45pm with Jory Sims, one of Dr. Jacalyn Lefevre NPs. Please arrive 15 minutes early for check-in. If this date/time does not work for you, please call our office to [place]. Our office will also call you  to help arrange outpatient monitor. Contact information: [address] [place] [address] [phone]         Garvin Fila, MD. Schedule an appointment as soon as possible for a visit in 4 week(s).   Specialties: Neurology, Radiology Why: For hospital follow-up Contact information: [address] [place] [address] [phone]                Allergies  Allergen Reactions   Demerol [Meperidine] Nausea And Vomiting   Nsaids Rash    Consultations: Neurology Cardiology   Procedures/Studies: CT HEAD WO CONTRAST  Result Date: 03/02/2021 CLINICAL DATA:  Neuro deficit, acute, stroke suspected. Additional provided: Left arm numbness. EXAM: CT HEAD WITHOUT CONTRAST TECHNIQUE: Contiguous axial images were obtained from the base of the skull through the vertex without intravenous contrast. COMPARISON:  Head CT 03/19/2016. FINDINGS: Brain: Mild generalized cerebral atrophy. Patchy and ill-defined hypoattenuation within the cerebral white matter, nonspecific but compatible chronic small vessel ischemic disease. Small chronic infarct within the right cerebellar hemisphere, new from the prior examination of 03/19/2016. There is no acute intracranial hemorrhage. No demarcated cortical infarct. No extra-axial fluid collection. No evidence of an intracranial mass. No midline shift. Vascular: No hyperdense vessel.  Atherosclerotic calcifications. Skull: Normal. Negative for fracture or focal lesion. Sinuses/Orbits: Visualized orbits show no acute finding. Small left frontal sinus mucous retention cyst. IMPRESSION: No evidence of acute intracranial abnormality. Chronic small-vessel ischemic changes within the cerebral white matter. Small chronic infarct within the right cerebellar hemisphere, new as compared to the head CT of 03/19/2016. Mild generalized cerebral atrophy. Small left frontal sinus mucous retention cyst. Electronically Signed   By: Kellie Simmering DO    On: 03/02/2021 08:03   MR ANGIO HEAD WO CONTRAST  Result Date: 03/02/2021 CLINICAL DATA:  Stroke follow-up. EXAM: MRI HEAD WITHOUT CONTRAST MRA HEAD WITHOUT CONTRAST MRA NECK WITHOUT CONTRAST TECHNIQUE: Multiplanar, multiecho pulse sequences of the brain and surrounding structures were obtained without intravenous contrast. Angiographic images of the Circle of Willis were obtained using MRA technique without intravenous contrast. Angiographic images of the neck were obtained using MRA technique without intravenous contrast. Carotid stenosis measurements (when applicable) are obtained utilizing NASCET criteria, using the distal internal carotid diameter as the denominator. COMPARISON:  CT head from the same day. FINDINGS: MRI HEAD FINDINGS Brain: Punctate acute infarct in the left posterior parietal lobe (series 3, image 28; series 7, image 11). Suspected additional punctate infarct in the left occipital lobe (series 3, image 26). No edema or mass effect. Moderate scattered T2/FLAIR hyperintensities within the white matter, nonspecific but most likely related to chronic microvascular ischemic disease. Remote infarcts and bilateral cerebellar hemispheres. Generalized atrophy with ex vacuo ventricular dilation. No hydrocephalus. No mass lesion or midline shift. No extra-axial fluid collections. Vascular: See below. Skull and upper cervical spine: Normal marrow signal. Sinuses/Orbits: Secretions within the left maxillary sinus. Remaining sinuses are clear. No acute orbital findings. Other: No sizable mastoid effusions. MRA HEAD FINDINGS Anterior circulation: Bilateral intracranial ICAs, MCAs, and ACAs are patent without proximal  flow limiting stenosis. Mild right M1 MCA narrowing. No aneurysm identified. Posterior circulation: Bilateral visualized intradural vertebral arteries, basilar artery, and posterior cerebral arteries are patent without proximal flow limiting stenosis. No aneurysm identified. MRA NECK  FINDINGS Aorta: Left common carotid artery origin and right brachiocephalic origin are incompletely imaged. Otherwise, image great vessel origins are patent. Carotid system: No significant (greater than 50%) stenosis. Left common carotid artery origin is incompletely imaged. Vertebral arteries: Left dominant. No evidence significant (greater than 50%) stenosis. Limited evaluation of the V3 vertebral arteries due to in plane flow on this noncontrast MRA. IMPRESSION: MRI head: 1. Punctate acute infarct in the posterior left parietal cortex with suspected additional punctate acute infarct in the left occipital lobe. These could be embolic in etiology. 2. Remote bilateral cerebellar lacunar infarcts and moderate chronic microvascular ischemic disease. MRA head: No large vessel occlusion or proximal hemodynamically significant stenosis. MRA Neck: No visible significant (greater than 50%) stenosis. Electronically Signed   By: Margaretha Sheffield MD   On: 03/02/2021 17:06   MR ANGIO NECK WO CONTRAST  Result Date: 03/02/2021 CLINICAL DATA:  Stroke follow-up. EXAM: MRI HEAD WITHOUT CONTRAST MRA HEAD WITHOUT CONTRAST MRA NECK WITHOUT CONTRAST TECHNIQUE: Multiplanar, multiecho pulse sequences of the brain and surrounding structures were obtained without intravenous contrast. Angiographic images of the Circle of Willis were obtained using MRA technique without intravenous contrast. Angiographic images of the neck were obtained using MRA technique without intravenous contrast. Carotid stenosis measurements (when applicable) are obtained utilizing NASCET criteria, using the distal internal carotid diameter as the denominator. COMPARISON:  CT head from the same day. FINDINGS: MRI HEAD FINDINGS Brain: Punctate acute infarct in the left posterior parietal lobe (series 3, image 28; series 7, image 11). Suspected additional punctate infarct in the left occipital lobe (series 3, image 26). No edema or mass effect. Moderate scattered  T2/FLAIR hyperintensities within the white matter, nonspecific but most likely related to chronic microvascular ischemic disease. Remote infarcts and bilateral cerebellar hemispheres. Generalized atrophy with ex vacuo ventricular dilation. No hydrocephalus. No mass lesion or midline shift. No extra-axial fluid collections. Vascular: See below. Skull and upper cervical spine: Normal marrow signal. Sinuses/Orbits: Secretions within the left maxillary sinus. Remaining sinuses are clear. No acute orbital findings. Other: No sizable mastoid effusions. MRA HEAD FINDINGS Anterior circulation: Bilateral intracranial ICAs, MCAs, and ACAs are patent without proximal flow limiting stenosis. Mild right M1 MCA narrowing. No aneurysm identified. Posterior circulation: Bilateral visualized intradural vertebral arteries, basilar artery, and posterior cerebral arteries are patent without proximal flow limiting stenosis. No aneurysm identified. MRA NECK FINDINGS Aorta: Left common carotid artery origin and right brachiocephalic origin are incompletely imaged. Otherwise, image great vessel origins are patent. Carotid system: No significant (greater than 50%) stenosis. Left common carotid artery origin is incompletely imaged. Vertebral arteries: Left dominant. No evidence significant (greater than 50%) stenosis. Limited evaluation of the V3 vertebral arteries due to in plane flow on this noncontrast MRA. IMPRESSION: MRI head: 1. Punctate acute infarct in the posterior left parietal cortex with suspected additional punctate acute infarct in the left occipital lobe. These could be embolic in etiology. 2. Remote bilateral cerebellar lacunar infarcts and moderate chronic microvascular ischemic disease. MRA head: No large vessel occlusion or proximal hemodynamically significant stenosis. MRA Neck: No visible significant (greater than 50%) stenosis. Electronically Signed   By: Margaretha Sheffield MD   On: 03/02/2021 17:06   MR BRAIN WO  CONTRAST  Result Date: 03/02/2021 CLINICAL DATA:  Stroke follow-up. EXAM:  MRI HEAD WITHOUT CONTRAST MRA HEAD WITHOUT CONTRAST MRA NECK WITHOUT CONTRAST TECHNIQUE: Multiplanar, multiecho pulse sequences of the brain and surrounding structures were obtained without intravenous contrast. Angiographic images of the Circle of Willis were obtained using MRA technique without intravenous contrast. Angiographic images of the neck were obtained using MRA technique without intravenous contrast. Carotid stenosis measurements (when applicable) are obtained utilizing NASCET criteria, using the distal internal carotid diameter as the denominator. COMPARISON:  CT head from the same day. FINDINGS: MRI HEAD FINDINGS Brain: Punctate acute infarct in the left posterior parietal lobe (series 3, image 28; series 7, image 11). Suspected additional punctate infarct in the left occipital lobe (series 3, image 26). No edema or mass effect. Moderate scattered T2/FLAIR hyperintensities within the white matter, nonspecific but most likely related to chronic microvascular ischemic disease. Remote infarcts and bilateral cerebellar hemispheres. Generalized atrophy with ex vacuo ventricular dilation. No hydrocephalus. No mass lesion or midline shift. No extra-axial fluid collections. Vascular: See below. Skull and upper cervical spine: Normal marrow signal. Sinuses/Orbits: Secretions within the left maxillary sinus. Remaining sinuses are clear. No acute orbital findings. Other: No sizable mastoid effusions. MRA HEAD FINDINGS Anterior circulation: Bilateral intracranial ICAs, MCAs, and ACAs are patent without proximal flow limiting stenosis. Mild right M1 MCA narrowing. No aneurysm identified. Posterior circulation: Bilateral visualized intradural vertebral arteries, basilar artery, and posterior cerebral arteries are patent without proximal flow limiting stenosis. No aneurysm identified. MRA NECK FINDINGS Aorta: Left common carotid artery origin  and right brachiocephalic origin are incompletely imaged. Otherwise, image great vessel origins are patent. Carotid system: No significant (greater than 50%) stenosis. Left common carotid artery origin is incompletely imaged. Vertebral arteries: Left dominant. No evidence significant (greater than 50%) stenosis. Limited evaluation of the V3 vertebral arteries due to in plane flow on this noncontrast MRA. IMPRESSION: MRI head: 1. Punctate acute infarct in the posterior left parietal cortex with suspected additional punctate acute infarct in the left occipital lobe. These could be embolic in etiology. 2. Remote bilateral cerebellar lacunar infarcts and moderate chronic microvascular ischemic disease. MRA head: No large vessel occlusion or proximal hemodynamically significant stenosis. MRA Neck: No visible significant (greater than 50%) stenosis. Electronically Signed   By: Margaretha Sheffield MD   On: 03/02/2021 17:06      Subjective: No issues this morning. No deficits.  Discharge Exam: Vitals:   03/03/21 1200 03/03/21 1300  BP:    Pulse:    Resp: 20 20  Temp:    SpO2:     Vitals:   03/03/21 0600 03/03/21 0734 03/03/21 1200 03/03/21 1300  BP: 131/61     Pulse: 69     Resp:   20 20  Temp: 98 F (36.7 C)     TempSrc: Oral     SpO2: 96% 92%    Weight:      Height:        General: Pt is alert, awake, not in acute distress Cardiovascular: RRR, S1/S2 +, no rubs, no gallops Respiratory: CTA bilaterally, no wheezing, no rhonchi Abdominal: Soft, NT, ND, bowel sounds + Extremities: no edema, no cyanosis    The results of significant diagnostics from this hospitalization (including imaging, microbiology, ancillary and laboratory) are listed below for reference.     Microbiology: Recent Results (from the past 240 hour(s))  Resp Panel by RT-PCR (Flu A&B, Covid) Nasopharyngeal Swab     Status: None   Collection Time: 03/02/21  7:29 AM   Specimen: Nasopharyngeal Swab; Nasopharyngeal(NP)  swabs  in vial transport medium  Result Value Ref Range Status   SARS Coronavirus 2 by RT PCR NEGATIVE NEGATIVE Final    Comment: (NOTE) SARS-CoV-2 target nucleic acids are NOT DETECTED.  The SARS-CoV-2 RNA is generally detectable in upper respiratory specimens during the acute phase of infection. The lowest concentration of SARS-CoV-2 viral copies this assay can detect is 138 copies/mL. A negative result does not preclude SARS-Cov-2 infection and should not be used as the sole basis for treatment or other patient management decisions. A negative result may occur with  improper specimen collection/handling, submission of specimen other than nasopharyngeal swab, presence of viral mutation(s) within the areas targeted by this assay, and inadequate number of viral copies(<138 copies/mL). A negative result must be combined with clinical observations, patient history, and epidemiological information. The expected result is Negative.  Fact Sheet for Patients:  EntrepreneurPulse.com.au  Fact Sheet for Healthcare Providers:  IncredibleEmployment.be  This test is no t yet approved or cleared by the [place] FDA and  has been authorized for detection and/or diagnosis of SARS-CoV-2 by FDA under an Emergency Use Authorization (EUA). This EUA will remain  in effect (meaning this test can be used) for the duration of the COVID-19 declaration under Section 564(b)(1) of the Act, 21 U.S.C.section 360bbb-3(b)(1), unless the authorization is terminated  or revoked sooner.       Influenza A by PCR NEGATIVE NEGATIVE Final   Influenza B by PCR NEGATIVE NEGATIVE Final    Comment: (NOTE) The Xpert Xpress SARS-CoV-2/FLU/RSV plus assay is intended as an aid in the diagnosis of influenza from Nasopharyngeal swab specimens and should not be used as a sole basis for treatment. Nasal washings and aspirates are unacceptable for Xpert Xpress  SARS-CoV-2/FLU/RSV testing.  Fact Sheet for Patients: EntrepreneurPulse.com.au  Fact Sheet for Healthcare Providers: IncredibleEmployment.be  This test is not yet approved or cleared by the [place] FDA and has been authorized for detection and/or diagnosis of SARS-CoV-2 by FDA under an Emergency Use Authorization (EUA). This EUA will remain in effect (meaning this test can be used) for the duration of the COVID-19 declaration under Section 564(b)(1) of the Act, 21 U.S.C. section 360bbb-3(b)(1), unless the authorization is terminated or revoked.  Performed at [facility], [address] [place], [place] [address]      Labs: BNP (last 3 results) No results for input(s): BNP in the last 8760 hours. Basic Metabolic Panel: Recent Labs  Lab 03/02/21 0708  NA 135  K 4.3  CL 98  CO2 27  GLUCOSE 105*  BUN 11  CREATININE 0.68  CALCIUM 10.1   Liver Function Tests: Recent Labs  Lab 03/02/21 0708  AST 24  ALT 16  ALKPHOS 112  BILITOT 1.0  PROT 7.1  ALBUMIN 4.5   No results for input(s): LIPASE, AMYLASE in the last 168 hours. No results for input(s): AMMONIA in the last 168 hours. CBC: Recent Labs  Lab 03/02/21 0708  WBC 4.6  NEUTROABS 2.5  HGB 15.7*  HCT 46.6*  MCV 92.3  PLT 210   Cardiac Enzymes: No results for input(s): CKTOTAL, CKMB, CKMBINDEX, TROPONINI in the last 168 hours. BNP: Invalid input(s): POCBNP CBG: No results for input(s): GLUCAP in the last 168 hours. D-Dimer No results for input(s): DDIMER in the last 72 hours. Hgb A1c Recent Labs    03/03/21 0040  HGBA1C 5.7*   Lipid Profile Recent Labs    03/03/21 0040  CHOL 185  HDL 61  LDLCALC 116*  TRIG  41  CHOLHDL 3.0   Thyroid function studies Recent Labs    03/02/21 1442  TSH 1.753   Anemia work up No results for input(s): VITAMINB12, FOLATE, FERRITIN, TIBC, IRON, RETICCTPCT in the last 72 hours. Urinalysis     Component Value Date/Time   COLORURINE COLORLESS (A) 03/02/2021 0708   APPEARANCEUR CLEAR 03/02/2021 0708   LABSPEC 1.008 03/02/2021 0708   PHURINE 7.0 03/02/2021 0708   GLUCOSEU NEGATIVE 03/02/2021 0708   HGBUR TRACE (A) 03/02/2021 0708   BILIRUBINUR NEGATIVE 03/02/2021 0708   KETONESUR 15 (A) 03/02/2021 0708   PROTEINUR NEGATIVE 03/02/2021 0708   NITRITE NEGATIVE 03/02/2021 0708   LEUKOCYTESUR NEGATIVE 03/02/2021 0708   Sepsis Labs Invalid input(s): PROCALCITONIN,  WBC,  LACTICIDVEN Microbiology Recent Results (from the past 240 hour(s))  Resp Panel by RT-PCR (Flu A&B, Covid) Nasopharyngeal Swab     Status: None   Collection Time: 03/02/21  7:29 AM   Specimen: Nasopharyngeal Swab; Nasopharyngeal(NP) swabs in vial transport medium  Result Value Ref Range Status   SARS Coronavirus 2 by RT PCR NEGATIVE NEGATIVE Final    Comment: (NOTE) SARS-CoV-2 target nucleic acids are NOT DETECTED.  The SARS-CoV-2 RNA is generally detectable in upper respiratory specimens during the acute phase of infection. The lowest concentration of SARS-CoV-2 viral copies this assay can detect is 138 copies/mL. A negative result does not preclude SARS-Cov-2 infection and should not be used as the sole basis for treatment or other patient management decisions. A negative result may occur with  improper specimen collection/handling, submission of specimen other than nasopharyngeal swab, presence of viral mutation(s) within the areas targeted by this assay, and inadequate number of viral copies(<138 copies/mL). A negative result must be combined with clinical observations, patient history, and epidemiological information. The expected result is Negative.  Fact Sheet for Patients:  EntrepreneurPulse.com.au  Fact Sheet for Healthcare Providers:  IncredibleEmployment.be  This test is no t yet approved or cleared by the [place] FDA and  has been authorized for  detection and/or diagnosis of SARS-CoV-2 by FDA under an Emergency Use Authorization (EUA). This EUA will remain  in effect (meaning this test can be used) for the duration of the COVID-19 declaration under Section 564(b)(1) of the Act, 21 U.S.C.section 360bbb-3(b)(1), unless the authorization is terminated  or revoked sooner.       Influenza A by PCR NEGATIVE NEGATIVE Final   Influenza B by PCR NEGATIVE NEGATIVE Final    Comment: (NOTE) The Xpert Xpress SARS-CoV-2/FLU/RSV plus assay is intended as an aid in the diagnosis of influenza from Nasopharyngeal swab specimens and should not be used as a sole basis for treatment. Nasal washings and aspirates are unacceptable for Xpert Xpress SARS-CoV-2/FLU/RSV testing.  Fact Sheet for Patients: EntrepreneurPulse.com.au  Fact Sheet for Healthcare Providers: IncredibleEmployment.be  This test is not yet approved or cleared by the [place] FDA and has been authorized for detection and/or diagnosis of SARS-CoV-2 by FDA under an Emergency Use Authorization (EUA). This EUA will remain in effect (meaning this test can be used) for the duration of the COVID-19 declaration under Section 564(b)(1) of the Act, 21 U.S.C. section 360bbb-3(b)(1), unless the authorization is terminated or revoked.  Performed at [facility], [address] [place], [place] [address]     SIGNED:   Cordelia Poche, MD [facility] 03/03/2021, 4:04 PM

## 2021-03-03 NOTE — Consult Note (Addendum)
Cardiology Consultation:   Patient ID: Nichole Lawrence MRN: [id number]; DOB: 06-19-1933  Admit date: 03/02/2021 Date of Consult: 03/03/2021  PCP:  Merrilee Seashore, MD   [facility] Providers Cardiologist:  Kirk Ruths, MD   {  Patient Profile:   Nichole Lawrence is a 87 y.o. female with a history of coronary calcification noted on chest CT dating back to 2018, SVT, COPD with emphysema and bronchiectasis followed by Pulmonology, who is being seen for the evaluation of atrial flutter at the request of Dr. Lonny Prude.   History of Present Illness:   Nichole Lawrence is a 87 year old female with the above history who is followed by Dr. Stanford Breed. Mainly followed for SVT which is well controlled on Cardizem. She does have a history of coronary calcifications noted on prior CT scans dating back to 2018. Nuclear stress test in 2016 and 2018 were both low risk with no evidence of ischemia. Last Echo in 2015 showed LVEF of 55-60%. She has chronic dyspnea from her COPD/emphysema and bronchiectasis and is followed by Pulmonology (Dr. Vaughan Browner). Patient was last seen by Dr. Pennie Banter in 08/2020 at which time patient reported dyspnea with more extreme activities but not with routine activities but was otherwise doing well from a cardiac standpoint.  Patient presented to the ED on 03/02/2021 for evaluation of left arm weakness and numbness and was found to have TIA. Also noted to be in new onset atrial flutter. Head CT showed no acute findings but did show evidence of chronic infarct. Brain MRI and head/neck MRA showed acute punctate infarct in the posterior left parietal cortex with suspected additional punctate acute infarct in the left occipital lobe as well as remote bilateral cerebellar lacunar infarcts. High-sensitivity troponin negative x2. WBC 4.6, Hgb 15.7, Plts 210. Na 135, K 4.3, Glucose 105, BUN 11, Cr 0.68. Albumin 4.5, AST 24, ALT 16, Alk Phos 112, Total Bili 1.0. Urine drug screen negative. COVID-19  negative. Patient admitted for further management of stroke. Cardiology consulted for atrial flutter.   At the time of this evaluation, patient sitting comfortably in hospital chair. Son and daughter are at bedside. No residual deficits from TIA. She states she was in her usual state of health until about 5:30am on the morning of 03/02/2021 when she noted left arm weakness and numbness. She has been doing well from a cardiac standpoint. She does note that for the last 3 months, she has noticed "fluttering" in her chest for about 15-20 minutes after waking up with some associated mild dizziness that will gradually resolves on its own. No chest pain, shortness of breath, syncope with this. She states this feels different than her prior SVT. She has chronic shortness of breath with exertion from pulmonary issues when she is exercising or if it is very humid but overall stable. No orthopnea, PND, or edema. No recent fevers or illnesses. No cough or nasal congestion. No abnormal bleeding in urine or stools.   Currently back in normal sinus rhythm.   Past Medical History:  Diagnosis Date   Breast cancer ([place])    SVT (supraventricular tachycardia) ([place])     Past Surgical History:  Procedure Laterality Date   BACK SURGERY     MASTECTOMY Right    TONSILLECTOMY     TUBAL LIGATION       Home Medications:  Prior to Admission medications   Medication Sig Start Date End Date Taking? Authorizing Provider  aspirin 81 MG tablet Take 81 mg by mouth daily.  Yes [provider]  diltiazem (CARDIZEM CD) 180 MG 24 hr capsule Take 1 capsule (180 mg total) by mouth daily. 11/05/20  Yes Lelon Perla, MD  MAGNESIUM SULFATE PO Take 250 mg by mouth daily.   Yes [provider]  Multiple Vitamin (MULTIVITAMIN) capsule Take 1 capsule by mouth daily.   Yes [provider]  umeclidinium-vilanterol (ANORO ELLIPTA) 62.5-25 MCG/INH AEPB Inhale 1 puff into the lungs daily. 03/16/20  Yes [name], MD  Respiratory Therapy Supplies (FLUTTER) DEVI Three times a day for cough and congestion Patient not taking: Reported on 03/02/2021 12/20/17   Parrett, Fonnie Mu, NP    Inpatient Medications: Scheduled Meds:   stroke: mapping our early stages of recovery book   Does not apply Once   aspirin EC  81 mg Oral Daily   diltiazem  180 mg Oral Daily   enoxaparin (LOVENOX) injection  40 mg Subcutaneous Q24H   magnesium oxide  200 mg Oral Daily   multivitamin with minerals  1 tablet Oral Daily   umeclidinium-vilanterol  1 puff Inhalation Daily   Continuous Infusions:  PRN Meds: acetaminophen **OR** acetaminophen (TYLENOL) oral liquid 160 mg/5 mL **OR** acetaminophen, senna-docusate  Allergies:    Allergies  Allergen Reactions   Demerol [Meperidine] Nausea And Vomiting   Nsaids Rash    Social History:   Social History   Socioeconomic History   Marital status: Widowed    Spouse name: Not on file   Number of children: 2   Years of education: Not on file   Highest education level: Not on file  Occupational History   Occupation: Retired Therapist, sports  Tobacco Use   Smoking status: Passive Smoke Exposure - Never Smoker   Smokeless tobacco: Never   Tobacco comments:    Husband and Father   Vaping Use   Vaping Use: Never used  Substance and Sexual Activity   Alcohol use: Yes    Alcohol/week: 0.0 standard drinks    Comment: Occasional   Drug use: No   Sexual activity: Not on file  Other Topics Concern   Not on file  Social History Narrative   [facility] (05/11/17):   Originally from [place]. [place]. Moved to [place] in 2006. Primarily worked as an Therapist, sports in the E.D., PACU, and in nursery. No known TB exposure. No mold, bird, or hot tub exposure. Remotely had a swimming pool. Enjoys exercising and walking. She does have a dog and enjoys gardening.    Social Determinants of Health   Financial Resource Strain: Not on file  Food Insecurity: Not on file  Transportation Needs:  Not on file  Physical Activity: Not on file  Stress: Not on file  Social Connections: Not on file  Intimate Partner Violence: Not on file    Family History:    Family History  Problem Relation Age of Onset   Heart disease Mother    Emphysema Mother        never smoked   Rheum arthritis Mother    Breast cancer Mother    Lung cancer Father    CAD Brother      ROS:  Please see the history of present illness.  Review of Systems  Constitutional:  Negative for chills and fever.  HENT:  Negative for congestion.   Respiratory:  Positive for shortness of breath. Negative for cough.   Cardiovascular:  Positive for palpitations. Negative for chest pain, orthopnea, leg swelling and PND.  Gastrointestinal:  Negative  for blood in stool, melena, nausea and vomiting.  Genitourinary:  Negative for hematuria.  Musculoskeletal:  Negative for falls and myalgias.  Neurological:  Positive for dizziness and focal weakness. Negative for loss of consciousness.  Endo/Heme/Allergies:  Does not bruise/bleed easily.  Psychiatric/Behavioral:  Negative for substance abuse.    Physical Exam/Data:   Vitals:   03/03/21 0600 03/03/21 0734 03/03/21 1200 03/03/21 1300  BP: 131/61     Pulse: 69     Resp:   20 20  Temp: 98 F (36.7 C)     TempSrc: Oral     SpO2: 96% 92%    Weight:      Height:        Intake/Output Summary (Last 24 hours) at 03/03/2021 1517 Last data filed at 03/02/2021 1700 Gross per 24 hour  Intake 240 ml  Output --  Net 240 ml   Last 3 Weights 03/02/2021 08/17/2020 05/07/2020  Weight (lbs) 135 lb 130 lb 131 lb 12.8 oz  Weight (kg) 61.236 kg 58.968 kg 59.784 kg     Body mass index is 20.53 kg/m.  General: 87 y.o. female resting comfortably in no acute distress. HEENT: Normocephalic and atraumatic. Sclera clear.  Neck: Supple. No carotid bruits. No JVD. Heart: RRR. Distinct S1 and S2. No murmurs, gallops, or rubs. Radial and distal pedal pulses 2+ and equal bilaterally. Lungs: No  increased work of breathing. Clear to ausculation bilaterally. No wheezes, rhonchi, or rales.  Abdomen: Soft, non-distended, and non-tender to palpation.  MSK: Normal strength and tone for age. Extremities: No lower extremity edema.    Skin: Warm and dry. Neuro: Alert and oriented x3. No focal deficits. Psych: Normal affect. Responds appropriately.   EKG:  The EKG was personally reviewed and demonstrates:  - Initial EKG on 03/02/2021: Atrial fibrillation/flutter, rate 104 bpm, with mild ST depression in leads V5-V6 with possible LBBB. - Repeat EKG on 03/02/2021 at 14:39: Normal sinus rhythm, rate 76 bpm, with PVC, and no acute ST/ T changes. ST depression in V5-V6 resolved. Left axis deviation.  Telemetry:  Telemetry was personally reviewed and demonstrates: Currently in the normal sinus rhythm with rates in the 70s to 80s. Intermittent PAT with rates as high as the 120s.  Relevant CV Studies:  Lexiscan Myoview 03/28/2017: The left ventricular ejection fraction is hyperdynamic (>65%). Nuclear stress EF: 72%. There was no ST segment deviation noted during stress. The study is normal. This is a low risk study.   Low risk stress nuclear study with normal perfusion and normal left ventricular regional and global systolic function.  Laboratory Data:  High Sensitivity Troponin:   Recent Labs  Lab 03/02/21 1442 03/02/21 1837  TROPONINIHS 7 6     Chemistry Recent Labs  Lab 03/02/21 0708  NA 135  K 4.3  CL 98  CO2 27  GLUCOSE 105*  BUN 11  CREATININE 0.68  CALCIUM 10.1  GFRNONAA >60  ANIONGAP 10    Recent Labs  Lab 03/02/21 0708  PROT 7.1  ALBUMIN 4.5  AST 24  ALT 16  ALKPHOS 112  BILITOT 1.0   Hematology Recent Labs  Lab 03/02/21 0708  WBC 4.6  RBC 5.05  HGB 15.7*  HCT 46.6*  MCV 92.3  MCH 31.1  MCHC 33.7  RDW 12.3  PLT 210   BNPNo results for input(s): BNP, PROBNP in the last 168 hours.  DDimer No results for input(s): DDIMER in the last 168  hours.   Radiology/Studies:  CT HEAD WO CONTRAST  Result Date: 03/02/2021 CLINICAL DATA:  Neuro deficit, acute, stroke suspected. Additional provided: Left arm numbness. EXAM: CT HEAD WITHOUT CONTRAST TECHNIQUE: Contiguous axial images were obtained from the base of the skull through the vertex without intravenous contrast. COMPARISON:  Head CT 03/19/2016. FINDINGS: Brain: Mild generalized cerebral atrophy. Patchy and ill-defined hypoattenuation within the cerebral white matter, nonspecific but compatible chronic small vessel ischemic disease. Small chronic infarct within the right cerebellar hemisphere, new from the prior examination of 03/19/2016. There is no acute intracranial hemorrhage. No demarcated cortical infarct. No extra-axial fluid collection. No evidence of an intracranial mass. No midline shift. Vascular: No hyperdense vessel.  Atherosclerotic calcifications. Skull: Normal. Negative for fracture or focal lesion. Sinuses/Orbits: Visualized orbits show no acute finding. Small left frontal sinus mucous retention cyst. IMPRESSION: No evidence of acute intracranial abnormality. Chronic small-vessel ischemic changes within the cerebral white matter. Small chronic infarct within the right cerebellar hemisphere, new as compared to the head CT of 03/19/2016. Mild generalized cerebral atrophy. Small left frontal sinus mucous retention cyst. Electronically Signed   By: Kellie Simmering DO   On: 03/02/2021 08:03   MR ANGIO HEAD WO CONTRAST  Result Date: 03/02/2021 CLINICAL DATA:  Stroke follow-up. EXAM: MRI HEAD WITHOUT CONTRAST MRA HEAD WITHOUT CONTRAST MRA NECK WITHOUT CONTRAST TECHNIQUE: Multiplanar, multiecho pulse sequences of the brain and surrounding structures were obtained without intravenous contrast. Angiographic images of the Circle of Willis were obtained using MRA technique without intravenous contrast. Angiographic images of the neck were obtained using MRA technique without intravenous  contrast. Carotid stenosis measurements (when applicable) are obtained utilizing NASCET criteria, using the distal internal carotid diameter as the denominator. COMPARISON:  CT head from the same day. FINDINGS: MRI HEAD FINDINGS Brain: Punctate acute infarct in the left posterior parietal lobe (series 3, image 28; series 7, image 11). Suspected additional punctate infarct in the left occipital lobe (series 3, image 26). No edema or mass effect. Moderate scattered T2/FLAIR hyperintensities within the white matter, nonspecific but most likely related to chronic microvascular ischemic disease. Remote infarcts and bilateral cerebellar hemispheres. Generalized atrophy with ex vacuo ventricular dilation. No hydrocephalus. No mass lesion or midline shift. No extra-axial fluid collections. Vascular: See below. Skull and upper cervical spine: Normal marrow signal. Sinuses/Orbits: Secretions within the left maxillary sinus. Remaining sinuses are clear. No acute orbital findings. Other: No sizable mastoid effusions. MRA HEAD FINDINGS Anterior circulation: Bilateral intracranial ICAs, MCAs, and ACAs are patent without proximal flow limiting stenosis. Mild right M1 MCA narrowing. No aneurysm identified. Posterior circulation: Bilateral visualized intradural vertebral arteries, basilar artery, and posterior cerebral arteries are patent without proximal flow limiting stenosis. No aneurysm identified. MRA NECK FINDINGS Aorta: Left common carotid artery origin and right brachiocephalic origin are incompletely imaged. Otherwise, image great vessel origins are patent. Carotid system: No significant (greater than 50%) stenosis. Left common carotid artery origin is incompletely imaged. Vertebral arteries: Left dominant. No evidence significant (greater than 50%) stenosis. Limited evaluation of the V3 vertebral arteries due to in plane flow on this noncontrast MRA. IMPRESSION: MRI head: 1. Punctate acute infarct in the posterior left  parietal cortex with suspected additional punctate acute infarct in the left occipital lobe. These could be embolic in etiology. 2. Remote bilateral cerebellar lacunar infarcts and moderate chronic microvascular ischemic disease. MRA head: No large vessel occlusion or proximal hemodynamically significant stenosis. MRA Neck: No visible significant (greater than 50%) stenosis. Electronically Signed   By: Margaretha Sheffield MD   On: 03/02/2021 17:06  MR ANGIO NECK WO CONTRAST  Result Date: 03/02/2021 CLINICAL DATA:  Stroke follow-up. EXAM: MRI HEAD WITHOUT CONTRAST MRA HEAD WITHOUT CONTRAST MRA NECK WITHOUT CONTRAST TECHNIQUE: Multiplanar, multiecho pulse sequences of the brain and surrounding structures were obtained without intravenous contrast. Angiographic images of the Circle of Willis were obtained using MRA technique without intravenous contrast. Angiographic images of the neck were obtained using MRA technique without intravenous contrast. Carotid stenosis measurements (when applicable) are obtained utilizing NASCET criteria, using the distal internal carotid diameter as the denominator. COMPARISON:  CT head from the same day. FINDINGS: MRI HEAD FINDINGS Brain: Punctate acute infarct in the left posterior parietal lobe (series 3, image 28; series 7, image 11). Suspected additional punctate infarct in the left occipital lobe (series 3, image 26). No edema or mass effect. Moderate scattered T2/FLAIR hyperintensities within the white matter, nonspecific but most likely related to chronic microvascular ischemic disease. Remote infarcts and bilateral cerebellar hemispheres. Generalized atrophy with ex vacuo ventricular dilation. No hydrocephalus. No mass lesion or midline shift. No extra-axial fluid collections. Vascular: See below. Skull and upper cervical spine: Normal marrow signal. Sinuses/Orbits: Secretions within the left maxillary sinus. Remaining sinuses are clear. No acute orbital findings. Other: No  sizable mastoid effusions. MRA HEAD FINDINGS Anterior circulation: Bilateral intracranial ICAs, MCAs, and ACAs are patent without proximal flow limiting stenosis. Mild right M1 MCA narrowing. No aneurysm identified. Posterior circulation: Bilateral visualized intradural vertebral arteries, basilar artery, and posterior cerebral arteries are patent without proximal flow limiting stenosis. No aneurysm identified. MRA NECK FINDINGS Aorta: Left common carotid artery origin and right brachiocephalic origin are incompletely imaged. Otherwise, image great vessel origins are patent. Carotid system: No significant (greater than 50%) stenosis. Left common carotid artery origin is incompletely imaged. Vertebral arteries: Left dominant. No evidence significant (greater than 50%) stenosis. Limited evaluation of the V3 vertebral arteries due to in plane flow on this noncontrast MRA. IMPRESSION: MRI head: 1. Punctate acute infarct in the posterior left parietal cortex with suspected additional punctate acute infarct in the left occipital lobe. These could be embolic in etiology. 2. Remote bilateral cerebellar lacunar infarcts and moderate chronic microvascular ischemic disease. MRA head: No large vessel occlusion or proximal hemodynamically significant stenosis. MRA Neck: No visible significant (greater than 50%) stenosis. Electronically Signed   By: Margaretha Sheffield MD   On: 03/02/2021 17:06   MR BRAIN WO CONTRAST  Result Date: 03/02/2021 CLINICAL DATA:  Stroke follow-up. EXAM: MRI HEAD WITHOUT CONTRAST MRA HEAD WITHOUT CONTRAST MRA NECK WITHOUT CONTRAST TECHNIQUE: Multiplanar, multiecho pulse sequences of the brain and surrounding structures were obtained without intravenous contrast. Angiographic images of the Circle of Willis were obtained using MRA technique without intravenous contrast. Angiographic images of the neck were obtained using MRA technique without intravenous contrast. Carotid stenosis measurements (when  applicable) are obtained utilizing NASCET criteria, using the distal internal carotid diameter as the denominator. COMPARISON:  CT head from the same day. FINDINGS: MRI HEAD FINDINGS Brain: Punctate acute infarct in the left posterior parietal lobe (series 3, image 28; series 7, image 11). Suspected additional punctate infarct in the left occipital lobe (series 3, image 26). No edema or mass effect. Moderate scattered T2/FLAIR hyperintensities within the white matter, nonspecific but most likely related to chronic microvascular ischemic disease. Remote infarcts and bilateral cerebellar hemispheres. Generalized atrophy with ex vacuo ventricular dilation. No hydrocephalus. No mass lesion or midline shift. No extra-axial fluid collections. Vascular: See below. Skull and upper cervical spine: Normal marrow signal. Sinuses/Orbits: Secretions  within the left maxillary sinus. Remaining sinuses are clear. No acute orbital findings. Other: No sizable mastoid effusions. MRA HEAD FINDINGS Anterior circulation: Bilateral intracranial ICAs, MCAs, and ACAs are patent without proximal flow limiting stenosis. Mild right M1 MCA narrowing. No aneurysm identified. Posterior circulation: Bilateral visualized intradural vertebral arteries, basilar artery, and posterior cerebral arteries are patent without proximal flow limiting stenosis. No aneurysm identified. MRA NECK FINDINGS Aorta: Left common carotid artery origin and right brachiocephalic origin are incompletely imaged. Otherwise, image great vessel origins are patent. Carotid system: No significant (greater than 50%) stenosis. Left common carotid artery origin is incompletely imaged. Vertebral arteries: Left dominant. No evidence significant (greater than 50%) stenosis. Limited evaluation of the V3 vertebral arteries due to in plane flow on this noncontrast MRA. IMPRESSION: MRI head: 1. Punctate acute infarct in the posterior left parietal cortex with suspected additional  punctate acute infarct in the left occipital lobe. These could be embolic in etiology. 2. Remote bilateral cerebellar lacunar infarcts and moderate chronic microvascular ischemic disease. MRA head: No large vessel occlusion or proximal hemodynamically significant stenosis. MRA Neck: No visible significant (greater than 50%) stenosis. Electronically Signed   By: Margaretha Sheffield MD   On: 03/02/2021 17:06     Assessment and Plan:   New Onset Atrial Fibrillation/Flutter Paroxysmal SVT - Patient presented with TIA and was found to be in atrial fibrillation/flutter. History of paroxysmal SVT but known atrial fibrillation/flutter. Has since converted back to normal sinus rhythm. Patient does not palpitations for about 15-20 minutes in the mornings after waking up for the past 3 months. - Potassium and TSH normal. Will check Magnesium. - Echo pending. - Continue home Cardizem CD 145m daily.  - CHA2DS2-VASc = 5 (stroke x2, age x2, female). Will START DOAC. Patient prefers once a day medication will start Xarelto 168mdaily (given CrCl <51). - Recommend outpatient monitor to assess for atrial flutter/fibrillation burden as well as to further evaluate palpitations in the morning (is this atrial flutter or SVT?).  Coronary Calcifications - Noted on CT dating back to 2018. - No angina. - Would not recommend aspirin given age and need for DOAC. - Start high-intensity statin.  Hyperlipidemia - Lipid panel this admission: Total Cholesterol 185, Triglycerides 41, HDL 61, LDL 116.  - LDL goal <70 given CAD. - Will start Lipitor 8030maily.   TIA - Neurology initially recommended DAPT but starting DOAC for atrial fibrillation/flutter. - Management per Neurology.   Risk Assessment/Risk Scores:   CHA2DS2-VASc Score = 6  This indicates a 9.7% annual risk of stroke. The patient's score is based upon: CHF History: No HTN History: No Diabetes History: No Stroke History: Yes Vascular Disease History:  Yes Age Score: 2 Gender Score: 1    For questions or updates, please contact [facility] Cree[facility]ase consult [email or web address] for contact info under    Signed, CalDarreld McleanA-C  03/03/2021 3:17 PM

## 2021-03-03 NOTE — Progress Notes (Signed)
Ordered event monitor to assess atrial fibrillation/flutter burden and assess for paroxysmal SVT. Please see rounding note from today for more information.  Darreld Mclean, PA-C 03/03/2021 3:28 PM

## 2021-03-03 NOTE — Progress Notes (Signed)
  Echocardiogram 2D Echocardiogram has been performed.  Matilde Bash 03/03/2021, 10:50 AM

## 2021-03-03 NOTE — Progress Notes (Signed)
PT Cancellation Note  Patient Details Name: Nichole Lawrence MRN: [id number] DOB: [birth year]   Cancelled Treatment:    Reason Eval/Treat Not Completed: PT screened, no needs identified, will sign off.  Pt independent with all mobility and is at baseline per OT.    Alvira Philips 03/03/2021, 11:16 AM Shihab States M,PT Acute Rehab Services [phone] (pager)

## 2021-03-03 NOTE — Care Management Obs Status (Signed)
[facility] NOTIFICATION   Patient Details  Name: Nichole Lawrence MRN: [id number] Date of Birth: 12-30-1932   Medicare Observation Status Notification Given:  Yes    Joanne Chars, LCSW 03/03/2021, 1:46 PM

## 2021-03-03 NOTE — Evaluation (Signed)
Speech Language Pathology Evaluation Patient Details Name: Nichole Lawrence MRN: [id number] DOB: 12/18/1932 Today's Date: 03/03/2021 Time: 0750-0808 SLP Time Calculation (min) (ACUTE ONLY): 18 min  Problem List:  Patient Active Problem List   Diagnosis Date Noted   Paresthesia of left upper and lower extremity 03/02/2021   TIA (transient ischemic attack) 03/02/2021   Cerebrovascular accident (CVA) ([place])    Bronchiectasis ([place]) 12/21/2017   Other emphysema ([place]) 12/11/2017   Pulmonary nodule 12/11/2017   Seasonal allergic rhinitis 05/11/2017   Arthritis 05/11/2017   Dyspnea on exertion 07/28/2015   Pulmonary infiltrates on CXR 07/28/2015   Chest pain 08/28/2014   SVT (supraventricular tachycardia) ([place]) 08/28/2014   Past Medical History:  Past Medical History:  Diagnosis Date   Breast cancer ([address])    SVT (supraventricular tachycardia) ([facility])    Past Surgical History:  Past Surgical History:  Procedure Laterality Date   BACK SURGERY     MASTECTOMY Right    TONSILLECTOMY     TUBAL LIGATION     HPI:  87 y.o. female presenting to ED 7/19 with new onset LUE weakness. CT (-) for acute findings. MRI (+) punctate acute infarct in posterior L parietal cortex with suspected additional punctate acute infarct in L occipital lobe. Remote bilateral cerebellar lacunar infacts and moderate chronic microvascular ischemic disease  also noted on imaging. PMHx significant for paroxysmal SVT, breast cancer s/p mastectomy, COPD and macular degeneration.   Assessment / Plan / Recommendation Clinical Impression  [facility] Mental Status Exam *SLUMS* administered with pt scoring 26/30 - Areas of strengths included attention, orientation, problem solving, spatial orientation.  Only areas with errors were with number manipulation/working memory and memory of word x1/5, recall of detail x1 from narrative. Functionally this pt appears to be managing quite well, is fluent without evidence of  dysarthria or aphasia and no follow up SLP indicated. Educated pt to results of testing and advised to have family intermittently follow up re: medications, bills, appointments, etc for life management.   Thanks for this consult.    SLP Assessment  SLP Recommendation/Assessment: Patient does not need any further Speech Lanaguage Pathology Services SLP Visit Diagnosis: Cognitive communication deficit (R41.841)    Follow Up Recommendations  None    Frequency and Duration     N/a      SLP Evaluation Cognition  Overall Cognitive Status: Within Functional Limits for tasks assessed Arousal/Alertness: Awake/alert Orientation Level: Oriented X4 Attention: Sustained Sustained Attention: Appears intact Memory: Impaired (recalled 4/5 items independently) Memory Impairment: Retrieval deficit (recalled 1/5 items with category cue) Awareness: Appears intact Problem Solving: Appears intact Safety/Judgment: Appears intact       Comprehension  Auditory Comprehension Overall Auditory Comprehension: Appears within functional limits for tasks assessed Yes/No Questions: Not tested Commands: Within Functional Limits Conversation: Complex Visual Recognition/Discrimination Discrimination: Not tested Reading Comprehension Reading Status: Not tested    Expression Expression Primary Mode of Expression: Verbal Verbal Expression Overall Verbal Expression: Appears within functional limits for tasks assessed Initiation: No impairment Level of Generative/Spontaneous Verbalization: Conversation Repetition: No impairment Naming: Not tested Non-Verbal Means of Communication: Not applicable Written Expression Dominant Hand: Right Written Expression: Within Functional Limits   Oral / Motor  Oral Motor/Sensory Function Overall Oral Motor/Sensory Function: Within functional limits Motor Speech Overall Motor Speech: Appears within functional limits for tasks assessed Respiration: Within functional  limits Resonance: Within functional limits Articulation: Within functional limitis Intelligibility: Intelligible Motor Planning: Not tested   GO  Macario Golds 03/03/2021, 8:48 AM  Kathleen Lime, MS [facility] SLP Acute Rehab Services Office [phone] Pager [phone]

## 2021-03-05 ENCOUNTER — Telehealth: Payer: Self-pay | Admitting: Student

## 2021-03-05 NOTE — Telephone Encounter (Signed)
New Message:      Patient is calling to schedule an appointment for a Monitor,

## 2021-03-08 ENCOUNTER — Encounter (HOSPITAL_BASED_OUTPATIENT_CLINIC_OR_DEPARTMENT_OTHER): Payer: Self-pay

## 2021-03-08 NOTE — Telephone Encounter (Signed)
Monitor was ordered 03/03/21 at time of hospitalization. Please ensure 14 day ZIO XT is mailed to patient for atrial fibrillation.   Thanks! Loel Dubonnet, NP

## 2021-03-09 ENCOUNTER — Telehealth (HOSPITAL_BASED_OUTPATIENT_CLINIC_OR_DEPARTMENT_OTHER): Payer: Self-pay | Admitting: Pharmacist

## 2021-03-09 ENCOUNTER — Other Ambulatory Visit (HOSPITAL_BASED_OUTPATIENT_CLINIC_OR_DEPARTMENT_OTHER): Payer: Self-pay

## 2021-03-09 ENCOUNTER — Other Ambulatory Visit (HOSPITAL_COMMUNITY): Payer: Self-pay

## 2021-03-09 NOTE — Telephone Encounter (Signed)
Pharmacy Transitions of Care Follow-up Telephone Call  Date of discharge: 03/03/2021  Discharge Diagnosis: CVA  How have you been since you were released from the hospital? Doing well. Feeling great overall. New to Xarelto since discharge, no signs or symptoms of bleeding. She has dentist appointment next week to finish a root canal - advised her to call ahead and let the office know she has started a blood thinner.   Medication changes made at discharge:  - START: atorvastatin 40 mg, Xarelto 15 mg daily   - STOPPED: aspirin 81 mg   Medication changes verified by the patient? Yes  Medication Accessibility: Home Pharmacy: [facility] on [address]    Was the patient provided with refills on discharged medications? Yes   Have all prescriptions been transferred from [facility] to home pharmacy? Completed now   Is the patient able to afford medications? Unsure, I advised patient on anticipated monthly copay of $47 for Xarelto. Advised patient that if this cost is currently or becomes too expensive to please let her doctor know so that other therapy options can be assessed.    Medication Review:  RIVAROXABAN (XARELTO)  Rivaroxaban 15 mg daily.  - Discussed importance of taking medication with food and around the same time everyday  - Reviewed potential DDIs with patient  - Advised patient of medications to avoid (NSAIDs, ASA)  - Educated that Tylenol (acetaminophen) will be the preferred analgesic to prevent risk of bleeding  - Emphasized importance of monitoring for signs and symptoms of bleeding (abnormal bruising, prolonged bleeding, nose bleeds, bleeding from gums, discolored urine, black tarry stools)  - Advised patient to alert all providers of anticoagulation therapy prior to starting a new medication or having a procedure   Follow-up Appointments:  Cardiology appointment tomorrow 03/10/2021 for cardiac monitoring - [employer] office - confirmed with patient  Harriet Pho,  PharmD [facility] [employer]  03/09/2021 12:22 PM

## 2021-03-10 ENCOUNTER — Other Ambulatory Visit: Payer: Self-pay | Admitting: Student

## 2021-03-10 ENCOUNTER — Ambulatory Visit (INDEPENDENT_AMBULATORY_CARE_PROVIDER_SITE_OTHER): Payer: Medicare Other

## 2021-03-10 ENCOUNTER — Other Ambulatory Visit: Payer: Self-pay

## 2021-03-10 ENCOUNTER — Encounter: Payer: Self-pay | Admitting: *Deleted

## 2021-03-10 DIAGNOSIS — R002 Palpitations: Secondary | ICD-10-CM

## 2021-03-10 DIAGNOSIS — I480 Paroxysmal atrial fibrillation: Secondary | ICD-10-CM | POA: Diagnosis not present

## 2021-03-10 DIAGNOSIS — G459 Transient cerebral ischemic attack, unspecified: Secondary | ICD-10-CM

## 2021-03-10 NOTE — Progress Notes (Unsigned)
30 day [employer] cardiac event monitor from office inventory, applied in office.

## 2021-03-12 ENCOUNTER — Telehealth: Payer: Self-pay | Admitting: Cardiology

## 2021-03-12 NOTE — Telephone Encounter (Signed)
Received call from [place] with [employer], she reports the pt had an episode this morning at 10:43am central time. Pt was first in sinus tachycardia at a rate of 114, then had a 15 second episode of PSVT with a rate of 174. The strip ends with pt's heart rate at 100 sinus rhythm. Called pt, pt reports this morning she felt shortness of breath and increased heart rate during the episode, she reports this happens " a lot of mornings". Pt reports now she feels "fine", denies chest pain, shortness of breath. Pt's pulse is 80 at this time.   This RN took strip to DOD Dr. Gwenlyn Found for review, he advised pt to follow up with her primary cardiologist. Pt advised to keep her upcoming 04/07/2021 appointment with Laurann Montana, NP. Pt also advised to call 911 or have someone take her to the emergency room if she develops chest pain, shortness of breath, or other concerning symptoms.

## 2021-03-12 NOTE — Telephone Encounter (Signed)
I have Abigail Butts from [employer] with a report for a serious ekg notification

## 2021-03-16 ENCOUNTER — Ambulatory Visit (HOSPITAL_BASED_OUTPATIENT_CLINIC_OR_DEPARTMENT_OTHER): Payer: Medicare Other | Admitting: Family

## 2021-03-16 ENCOUNTER — Telehealth: Payer: Self-pay | Admitting: Cardiology

## 2021-03-16 NOTE — Telephone Encounter (Signed)
Received call from [employer] regarding abnormal EKG. Patient had short episode of Afib RVR 140 bpm this morning at 6:34am with return to SR. In review of chart patient is already on Xarelto. I spoke with patient who reported symptoms of palpitations, dizziness also slightly light-headed. Reports episode was brief and resolved. No further episodes. I instructed that she remain on Xarelto. Asked that she keep a log of symptoms as well to determine if these continue to correlate with episodes of Afib RVR. She voiced understanding and thanked me for call back.

## 2021-03-19 ENCOUNTER — Telehealth: Payer: Self-pay | Admitting: Pulmonary Disease

## 2021-03-19 ENCOUNTER — Other Ambulatory Visit (HOSPITAL_COMMUNITY): Payer: Self-pay

## 2021-03-19 NOTE — Telephone Encounter (Signed)
ATC patient, LMTCB 

## 2021-03-19 NOTE — Telephone Encounter (Signed)
LMTCB

## 2021-03-21 NOTE — Telephone Encounter (Signed)
I called and spoke with pt and she stated that she wanted to schedule an appt with PM to discuss her breathing issues and would like this to be at the end of Sept.  Pt scheduled for 9/28 at 245 and nothing further is needed.

## 2021-03-22 ENCOUNTER — Other Ambulatory Visit: Payer: Self-pay | Admitting: Pulmonary Disease

## 2021-03-23 DIAGNOSIS — R319 Hematuria, unspecified: Secondary | ICD-10-CM | POA: Diagnosis not present

## 2021-03-23 DIAGNOSIS — Z0000 Encounter for general adult medical examination without abnormal findings: Secondary | ICD-10-CM | POA: Diagnosis not present

## 2021-03-23 DIAGNOSIS — E875 Hyperkalemia: Secondary | ICD-10-CM | POA: Diagnosis not present

## 2021-03-23 DIAGNOSIS — R5383 Other fatigue: Secondary | ICD-10-CM | POA: Diagnosis not present

## 2021-03-24 DIAGNOSIS — H353211 Exudative age-related macular degeneration, right eye, with active choroidal neovascularization: Secondary | ICD-10-CM | POA: Diagnosis not present

## 2021-03-26 ENCOUNTER — Telehealth: Payer: Self-pay

## 2021-03-26 NOTE — Telephone Encounter (Signed)
   Cardiac Monitor Alert  Date of alert:  03/26/2021   Patient Name: Nichole Lawrence  DOB: 02/25/1933  MRN: [id number]   [facility] Cardiologist: Kirk Ruths, MD  [facility] EP:  None    Monitor Information: Cardiac Event Monitor [[employer]] 30 Day Reason:  Palpitations, TIA, PAF Ordering provider:  Dr. Stanford Breed { Alert Supraventricular Tachycardia - fastest HR:  198 This is the 2nd alert for this rhythm.   Next Cardiology Appointment   Date:  8/24  Provider:  Laurann Montana, NP  The patient was contacted today.  She is asymptomatic. Arrhythmia, symptoms and history reviewed with Dr. Percival Spanish (DOD).   Plan:  Continue to Monitor, patient has upcoming appointment, ER precautions given.    Chriss Driver, RN  03/26/2021 4:16 PM

## 2021-03-30 DIAGNOSIS — J432 Centrilobular emphysema: Secondary | ICD-10-CM | POA: Diagnosis not present

## 2021-03-30 DIAGNOSIS — D692 Other nonthrombocytopenic purpura: Secondary | ICD-10-CM | POA: Diagnosis not present

## 2021-03-30 DIAGNOSIS — I480 Paroxysmal atrial fibrillation: Secondary | ICD-10-CM | POA: Diagnosis not present

## 2021-03-30 DIAGNOSIS — M810 Age-related osteoporosis without current pathological fracture: Secondary | ICD-10-CM | POA: Diagnosis not present

## 2021-03-30 DIAGNOSIS — Z0000 Encounter for general adult medical examination without abnormal findings: Secondary | ICD-10-CM | POA: Diagnosis not present

## 2021-03-30 DIAGNOSIS — M150 Primary generalized (osteo)arthritis: Secondary | ICD-10-CM | POA: Diagnosis not present

## 2021-03-30 DIAGNOSIS — E782 Mixed hyperlipidemia: Secondary | ICD-10-CM | POA: Diagnosis not present

## 2021-04-05 NOTE — Progress Notes (Signed)
Office Visit    Patient Name: Nichole Lawrence Date of Encounter: 04/07/2021  PCP:  Merrilee Seashore, [place]  Cardiologist:  Kirk Ruths, MD  Advanced Practice Provider:  No care team member to display Electrophysiologist:  None      Chief Complaint    Nichole Lawrence is a 87 y.o. female with a hx of coronary calcification, SVT, dyspnea, COPD, bronchiectasis, CVA, remote breast cancer s/p mastectomy, macular degeneration presents today for hospital follow-up  Past Medical History    Past Medical History:  Diagnosis Date   Breast cancer ([place])    SVT (supraventricular tachycardia) ([place])    Past Surgical History:  Procedure Laterality Date   BACK SURGERY     MASTECTOMY Right    TONSILLECTOMY     TUBAL LIGATION      Allergies  Allergies  Allergen Reactions   Demerol [Meperidine] Nausea And Vomiting   Nsaids Rash    History of Present Illness    Nichole Lawrence is a 87 y.o. female with a hx of coronary calcification, SVT, dyspnea, COPD, bronchiectasis, CVA, remote breast cancer s/p mastectomy, macular degeneration last seen while hospitalized.  Last seen 08/17/20 with Dr. Stanford Breed.  She noted no recurrent SVT and diltiazem was continued at present dose.  Aspirin was continued given coronary calcification and she declined statin.  She was recommended for follow-up 1 year.  Hospitalized 03/03/2019 - 03/03/2021 with new onset left arm weakness.  She was diagnosed with acute stroke, likely embolic, in the left occipital, left parietal, right frontal.  Due to evidence of atrial fibrillation/flutter in the ED prior to transfer to [facility].  She was started on Xarelto 15 mg daily prior to discharge. 30-day monitor was placed.  Monitor alert 03/12/21 with ST 114 bpm with 15 second episode of PSVT rate 174 bpm. Symptomatic with shortness of breath and palpitations. Monitor alert 03/16/21 with atrial fib RVR 140bpm with quick return to NSR. She  endorsed palpitations, dizziness, lightheadedness. Repeat event for SVT 03/26/21.   She presents today for follow up with her daughter Eustaquio Maize. She walks in the morning with her 21 year old dog and does yoga. Tells me her breathing has been overall good since being home. She will note a little bit of dyspnea in the mornings prior to her walk while starting her morning coffee. She notes very short episodes of palpitations about twice per week.  The self resolved.  Reports no chest pain, pressure, tightness.  Denies orthopnea, edema, PND.  EKGs/Labs/Other Studies Reviewed:   The following studies were reviewed today:  Echo 03/03/21  1. Left ventricular ejection fraction, by estimation, is 45 to 50%. The  left ventricle has mildly decreased function. The left ventricle  demonstrates global hypokinesis. Left ventricular diastolic parameters are  consistent with Grade I diastolic  dysfunction (impaired relaxation).   2. Right ventricular systolic function is normal. The right ventricular  size is normal. There is normal pulmonary artery systolic pressure. The  estimated right ventricular systolic pressure is 99991111 mmHg.   3. The mitral valve is normal in structure. Trivial mitral valve  regurgitation. No evidence of mitral stenosis.   4. The aortic valve is tricuspid. Aortic valve regurgitation is mild. No  aortic stenosis is present.   5. The inferior vena cava is dilated in size with <50% respiratory  variability, suggesting right atrial pressure of 15 mmHg.   EKG:  EKG is  ordered today.  The  ekg ordered today demonstrates SR 67 bpm with first degree AV block (PR 234m), no acute ST/T wave changes.   Recent Labs: 03/02/2021: ALT 16; BUN 11; Creatinine, Ser 0.68; Hemoglobin 15.7; Platelets 210; Potassium 4.3; Sodium 135; TSH 1.753 03/03/2021: Magnesium 2.1  Recent Lipid Panel    Component Value Date/Time   CHOL 185 03/03/2021 0040   TRIG 41 03/03/2021 0040   HDL 61 03/03/2021 0040   CHOLHDL  3.0 03/03/2021 0040   VLDL 8 03/03/2021 0040   LDLCALC 116 (H) 03/03/2021 0040    Risk Assessment/Calculations:   CHA2DS2-VASc Score = 6  This indicates a 9.7% annual risk of stroke. The patient's score is based upon: CHF History: No HTN History: No Diabetes History: No Stroke History: Yes Vascular Disease History: Yes Age Score: 2 Gender Score: 1   Home Medications   Current Meds  Medication Sig   diltiazem (CARDIZEM CD) 180 MG 24 hr capsule Take 1 capsule (180 mg total) by mouth daily.   losartan (COZAAR) 25 MG tablet Take 0.5 tablets (12.5 mg total) by mouth daily.   MAGNESIUM SULFATE PO Take 250 mg by mouth daily.   Multiple Vitamin (MULTIVITAMIN) capsule Take 1 capsule by mouth daily.   Respiratory Therapy Supplies (FLUTTER) DEVI Three times a day for cough and congestion   Rivaroxaban (XARELTO) 15 MG TABS tablet Take 1 tablet (15 mg total) by mouth daily.   umeclidinium-vilanterol (ANORO ELLIPTA) 62.5-25 MCG/INH AEPB Inhale 1 puff by mouth once daily   [DISCONTINUED] atorvastatin (LIPITOR) 40 MG tablet Take 1 tablet (40 mg total) by mouth daily.     Review of Systems      All other systems reviewed and are otherwise negative except as noted above.  Physical Exam    VS:  BP 124/70 (BP Location: Left Arm, Patient Position: Sitting, Cuff Size: Normal)   Pulse 67   Ht '5\' 8"'$  (1.727 m)   Wt 130 lb (59 kg)   BMI 19.77 kg/m  , BMI Body mass index is 19.77 kg/m.  Wt Readings from Last 3 Encounters:  04/07/21 130 lb (59 kg)  03/02/21 135 lb (61.2 kg)  08/17/20 130 lb (59 kg)     GEN: Well nourished, well developed, in no acute distress. HEENT: normal. Neck: Supple, no JVD, carotid bruits, or masses. Cardiac: RRR, no murmurs, rubs, or gallops. No clubbing, cyanosis, edema.  Radials/PT 2+ and equal bilaterally.  Respiratory:  Respirations regular and unlabored, clear to auscultation bilaterally. GI: Soft, nontender, nondistended. MS: No deformity or atrophy. Skin:  Warm and dry, no rash. Neuro:  Strength and sensation are intact. Psych: Normal affect.  Assessment & Plan    History of CVA -no residual deficits.  Continue optimal BP, lipid control.  Continue atorvastatin, Xarelto.  Paroxysmal atrial fibrillation/flutter/chronic anticoagulation -maintaining normal sinus rhythm by EKG today.  She finishes wearing her preventives 30-day monitor today and will await report.  Continue current dose of diltiazem 180 mg daily.  Of note, diltiazem contraindicated in the setting of HFrEF.  Most recent echo with LVEF 45-50%.  Will defer to Dr. CStanford Breeduntil after monitor results whether she should transition to carvedilol.  Continue Xarelto 15 mg daily.  Reduced dose appropriate.  Denies bleeding complications.  Education regarding anticoagulation provided.   Hypertension - BP well controlled. Continue current antihypertensive regimen.  Add Losartan 12.'5mg'$  QD for HF benefit.   Hyperlipidemia - 02/2021 LDL 116.  Repeat lipid panel by primary care 03/23/2021 with LDL 49.  Rosuvastatin '40mg'$   QD initiated during recent hospitalization.  LDL goal less than 70.  Continue rosuvastatin 40 mg daily.  Refill provided.  Coronary artery calcification - Stable with no anginal symptoms. No indication for ischemic evaluation.  GDMT includes atorvastatin.  Consider beta-blocker pending monitor results, as above.  No aspirin due to chronic anticoagulation.  Reduced LVEF -echo during admission with LVEF 45 to 50%.  Add losartan 12.5 mg daily for optimization of heart failure medications.  May need to consider discontinuation of diltiazem pending monitor results and transition to carvedilol. Euvolemic and well compensated on exam.  No indication for loop diuretic at this time. NYHA I-II.  Disposition: Follow up in 2 month(s) with Dr Stanford Breed or APP.  Signed, Loel Dubonnet, NP 04/07/2021, 9:10 AM [place]

## 2021-04-07 ENCOUNTER — Ambulatory Visit (HOSPITAL_BASED_OUTPATIENT_CLINIC_OR_DEPARTMENT_OTHER): Payer: Medicare Other | Admitting: Family

## 2021-04-07 ENCOUNTER — Encounter (HOSPITAL_BASED_OUTPATIENT_CLINIC_OR_DEPARTMENT_OTHER): Payer: Self-pay | Admitting: Family

## 2021-04-07 ENCOUNTER — Other Ambulatory Visit: Payer: Self-pay

## 2021-04-07 VITALS — BP 124/70 | HR 67 | Ht 68.0 in | Wt 130.0 lb

## 2021-04-07 DIAGNOSIS — I5020 Unspecified systolic (congestive) heart failure: Secondary | ICD-10-CM | POA: Diagnosis not present

## 2021-04-07 DIAGNOSIS — E785 Hyperlipidemia, unspecified: Secondary | ICD-10-CM | POA: Diagnosis not present

## 2021-04-07 DIAGNOSIS — I2510 Atherosclerotic heart disease of native coronary artery without angina pectoris: Secondary | ICD-10-CM

## 2021-04-07 DIAGNOSIS — I480 Paroxysmal atrial fibrillation: Secondary | ICD-10-CM | POA: Diagnosis not present

## 2021-04-07 DIAGNOSIS — I471 Supraventricular tachycardia: Secondary | ICD-10-CM

## 2021-04-07 MED ORDER — ATORVASTATIN CALCIUM 40 MG PO TABS: 40.0000 mg | ORAL_TABLET | Freq: Every day | ORAL | 3 refills | Status: DC

## 2021-04-07 MED ORDER — LOSARTAN POTASSIUM 25 MG PO TABS: 12.5000 mg | ORAL_TABLET | Freq: Every day | ORAL | 2 refills | Status: DC

## 2021-04-07 NOTE — Patient Instructions (Addendum)
Medication Instructions:  Your physician has recommended you make the following change in your medication:   START Losartan half tablet (12.'5mg'$ ) once daily  *If you need a refill on your cardiac medications before your next appointment, please call your pharmacy*  Lab Work: None ordered today Your cholesterol panel looked fantastic!  Testing/Procedures: Your EKG today shows normal sinus rhythm.    Follow-Up: At [facility], you and your health needs are our priority.  As part of our continuing mission to provide you with exceptional heart care, we have created designated Provider Care Teams.  These Care Teams include your primary Cardiologist (physician) and Advanced Practice Providers (APPs -  Physician Assistants and Nurse Practitioners) who all work together to provide you with the care you need, when you need it.  We recommend signing up for the patient portal called "MyChart".  Sign up information is provided on this After Visit Summary.  MyChart is used to connect with patients for Virtual Visits (Telemedicine).  Patients are able to view lab/test results, encounter notes, upcoming appointments, etc.  Non-urgent messages can be sent to your provider as well.   To learn more about what you can do with MyChart, go to [email or web address].    Your next appointment:   2 month(s)  The format for your next appointment:   In Person  Provider:   You may see Kirk Ruths, MD or one of the following Advanced Practice Providers on your designated Care Team:   Sande Rives, PA-C Coletta Memos, FNP Loel Dubonnet, NP    Other Instructions  Heart Healthy Diet Recommendations: A low-salt diet is recommended. Meats should be grilled, baked, or boiled. Avoid fried foods. Focus on lean protein sources like fish or chicken with vegetables and fruits. The American Heart Association is a Microbiologist!    Exercise recommendations: The American Heart Association recommends 150  minutes of moderate intensity exercise weekly. Try 30 minutes of moderate intensity exercise 4-5 times per week. This could include walking, jogging, or swimming.

## 2021-04-12 ENCOUNTER — Inpatient Hospital Stay: Payer: Self-pay | Admitting: Adult Health

## 2021-04-13 ENCOUNTER — Telehealth: Payer: Self-pay | Admitting: Emergency Medicine

## 2021-04-13 NOTE — Telephone Encounter (Signed)
Patient called and discussed Dr.Sethi's review and findings regarding her 24 hour cardiac monitor study.  Patient agreed to Dr. Clydene Fake recommendation and will continue to take blood thinners as ordered.  Patient denied further questions, verbalized understanding and expressed appreciation for the phone call.

## 2021-04-13 NOTE — Telephone Encounter (Signed)
-----   Message from Garvin Fila, MD sent at 04/13/2021  2:43 PM EDT ----- Mitchell Heir inform the patient that 30-day heart monitor study showed only a very brief episode of irregular heart rhythm and she needs to continue her blood thinner Xarelto.  No new changes ----- Message ----- From: Lelon Perla, MD Sent: 04/13/2021  10:01 AM EDT To: Garvin Fila, MD

## 2021-04-23 ENCOUNTER — Ambulatory Visit: Payer: Medicare Other | Admitting: Adult Health

## 2021-04-28 DIAGNOSIS — H353211 Exudative age-related macular degeneration, right eye, with active choroidal neovascularization: Secondary | ICD-10-CM | POA: Diagnosis not present

## 2021-05-12 ENCOUNTER — Encounter: Payer: Self-pay | Admitting: Pulmonary Disease

## 2021-05-12 ENCOUNTER — Other Ambulatory Visit: Payer: Self-pay

## 2021-05-12 ENCOUNTER — Ambulatory Visit: Payer: Medicare Other | Admitting: Pulmonary Disease

## 2021-05-12 VITALS — BP 124/58 | HR 73 | Temp 97.7°F | Ht 66.5 in | Wt 130.4 lb

## 2021-05-12 DIAGNOSIS — J439 Emphysema, unspecified: Secondary | ICD-10-CM

## 2021-05-12 NOTE — Patient Instructions (Signed)
Continue the Anoro inhaler We get a CT chest without contrast for reassessment of the lungs Return to clinic in 6 months

## 2021-05-12 NOTE — Progress Notes (Signed)
Nichole Lawrence    [id number]    16-Aug-1932  Primary Care Physician:Ramachandran, Mauro Kaufmann, MD  Referring Physician: Merrilee Seashore, [address] [place],  [place] [address]  Chief complaint: Follow-up for emphysema, bronchiectasis, lung nodules.  HPI: 87 year old with history of pulmonary emphysema, bronchiectasis, pulmonary nodules.  Previously followed by Dr. Ashok Cordia She is a non-smoker with exposure to secondhand smoke.  No evidence of alpha-1 antitrypsin deficiency.  Seen in pulmonary clinic in May 2019 with pneumonia, bronchiectasis exacerbation. CT at that time showed worsening pulmonary opacities.  However follow-up chest x-ray showed improvement after treatment with antibiotics  Pets: Dog, no cats, birds. Occupation: Used to work as an Therapist, sports in ED, Marine scientist and nursery. Exposures: No known exposures, no mold, hot tub, Jacuzzi Smoking history: Never smoker exposed to secondhand smoke  Travel history: Originally from [place]. Relevant family history: Father had COPD.  He was a smoker.  Interim history: Continues on anoro inhaler Has some shortness of breath when she uses her arms but no issues when doing aerobic exercise such as climbing up stairs Denies any cough, sputum production, fevers, chills. She wants her lungs checked out as her last CT scan showed some inflammatory nodules.   Outpatient Encounter Medications as of 05/12/2021  Medication Sig   atorvastatin (LIPITOR) 40 MG tablet Take 1 tablet (40 mg total) by mouth daily.   diltiazem (CARDIZEM CD) 180 MG 24 hr capsule Take 1 capsule (180 mg total) by mouth daily.   losartan (COZAAR) 25 MG tablet Take 0.5 tablets (12.5 mg total) by mouth daily.   MAGNESIUM SULFATE PO Take 250 mg by mouth daily.   Multiple Vitamin (MULTIVITAMIN) capsule Take 1 capsule by mouth daily.   Rivaroxaban (XARELTO) 15 MG TABS tablet Take 1 tablet (15 mg total) by mouth daily.   umeclidinium-vilanterol (ANORO ELLIPTA)  62.5-25 MCG/INH AEPB Inhale 1 puff by mouth once daily   Respiratory Therapy Supplies (FLUTTER) DEVI Three times a day for cough and congestion (Patient not taking: Reported on 05/12/2021)   No facility-administered encounter medications on file as of 05/12/2021.   Physical Exam: Blood pressure (!) 124/58, pulse 73, temperature 97.7 F (36.5 C), temperature source Oral, height 5' 6.5" (1.689 m), weight 130 lb 6.4 oz (59.1 kg), SpO2 96 %. Gen:      No acute distress HEENT:  EOMI, sclera anicteric Neck:     No masses; no thyromegaly Lungs:    Clear to auscultation bilaterally; normal respiratory effort CV:         Regular rate and rhythm; no murmurs Abd:      + bowel sounds; soft, non-tender; no palpable masses, no distension Ext:    No edema; adequate peripheral perfusion Skin:      Warm and dry; no rash Neuro: alert and oriented x 3 Psych: normal mood and affect   Data Reviewed: Imaging CT CHEST W/ CONTRAST 07/01/15   nodularity noted in bilateral lungs and multiple lobes upper, middle, lingula, and lower. No pathologic mediastinal adenopathy. Largest nodule measures 1 cm in right upper lobe with some spiculation. No pleural effusion or thickening. No pericardial effusion. very mild apical predominant emphysema. Mild pectus excavatum.   CT CHEST W/O 05/12/17   New 6 mm nodule in left lower lobe. Diffuse bronchial wall thickening with findings consistent with bronchiectasis and right middle and upper lobes bilaterally. Tree-in-bud opacities predominantly within right upper and right middle lobes. No pleural effusion or thickening. No pericardial  effusion. No pathologic mediastinal adenopathy.   CT chest without contrast 12/19/2017- 17 but opacity, patchy consolidation, groundglass attenuation, nodularity, bronchiectasis.  Stable 6 mm left lower lobe nodule.  CT high-resolution 02/21/2019- no interstitial lung disease.  Nodules, tree-in-bud bilaterally.  More than the right upper and middle lobe.   Improved compared to prior. I have reviewed the images personally.  PFT 06/12/17: FVC 2.13 L (74%) FEV1 1.76 L (82%) FEV1/FVC 0.83 FEF 25-75 1.70 L (118%) positive bronchodilator response TLC 5.67 L (103%) RV 134% ERV 80% DLCO corrected 53% 07/30/15: FVC 2.68 L (90%) FEV1 1.94 L (88%) FEV1/FVC 0.72 FEF 25-75 1.32 L (85%) negative bronchodilator response TLC 6.49 L (117%) RV 149% ERV 117% DLCO uncorrected 54%  LABS 05/11/17 Alpha-1 antitrypsin: MM (154) ESR: 3 CRP: 0.3 ANA:  1:160, speckled RA:  <14 Anti-CCP:  <16   Assessment:  Pulmonary emphysema Doing well on Anoro.  Continue the same.  Allergic rhinitis with postnasal drip Symptoms for the past week consistent with allergic rhinitis with postnasal drip Advised over-the-counter steroid nasal spray and antihistamine.   Bronchiectasis She had an exacerbation in May 2019. CT in 2020 reviewed with improvement in nodularity and bronchial inflammation.  There is no evidence of interstitial lung disease She currently is not making any sputum hence not be tested for cultures to rule out MAI She has declined bronchoscopy in the past.  We will continue to monitor her symptoms for any recurrence.  She is concerned about this nodule sinus requesting reassessment CT chest ordered  Health maintenance 07/04/2013- Prevnar 09/27/2016-Pneumovax  She is up-to-date with Covid vaccination  Plan/Recommendations: - Continue Anoro - OTC steroid nasal spray, antihistamine - CT chest without contrast  [name] MD [facility] 05/12/2021, 2:56 PM  CC: Merrilee Seashore, MD

## 2021-05-14 DIAGNOSIS — M810 Age-related osteoporosis without current pathological fracture: Secondary | ICD-10-CM | POA: Diagnosis not present

## 2021-05-14 DIAGNOSIS — M150 Primary generalized (osteo)arthritis: Secondary | ICD-10-CM | POA: Diagnosis not present

## 2021-05-14 DIAGNOSIS — E782 Mixed hyperlipidemia: Secondary | ICD-10-CM | POA: Diagnosis not present

## 2021-05-20 ENCOUNTER — Other Ambulatory Visit: Payer: Self-pay

## 2021-05-20 ENCOUNTER — Ambulatory Visit (INDEPENDENT_AMBULATORY_CARE_PROVIDER_SITE_OTHER)
Admission: RE | Admit: 2021-05-20 | Discharge: 2021-05-20 | Disposition: A | Discharge status: Home / Self Care | Payer: Medicare Other | Source: Ambulatory Visit | Attending: Pulmonary Disease | Admitting: Pulmonary Disease

## 2021-05-20 DIAGNOSIS — J439 Emphysema, unspecified: Secondary | ICD-10-CM | POA: Diagnosis not present

## 2021-05-20 DIAGNOSIS — J449 Chronic obstructive pulmonary disease, unspecified: Secondary | ICD-10-CM | POA: Diagnosis not present

## 2021-05-20 DIAGNOSIS — I700 Atherosclerosis of aorta: Secondary | ICD-10-CM | POA: Diagnosis not present

## 2021-05-20 DIAGNOSIS — R918 Other nonspecific abnormal finding of lung field: Secondary | ICD-10-CM | POA: Diagnosis not present

## 2021-05-20 DIAGNOSIS — R911 Solitary pulmonary nodule: Secondary | ICD-10-CM | POA: Diagnosis not present

## 2021-05-20 DIAGNOSIS — J479 Bronchiectasis, uncomplicated: Secondary | ICD-10-CM | POA: Diagnosis not present

## 2021-05-26 DIAGNOSIS — J438 Other emphysema: Secondary | ICD-10-CM

## 2021-05-26 MED ORDER — SODIUM CHLORIDE 3 % IN NEBU: INHALATION_SOLUTION | RESPIRATORY_TRACT | 12 refills | Status: DC | PRN

## 2021-05-27 NOTE — Telephone Encounter (Signed)
Created in error

## 2021-06-02 DIAGNOSIS — H353122 Nonexudative age-related macular degeneration, left eye, intermediate dry stage: Secondary | ICD-10-CM | POA: Diagnosis not present

## 2021-06-02 DIAGNOSIS — H353211 Exudative age-related macular degeneration, right eye, with active choroidal neovascularization: Secondary | ICD-10-CM | POA: Diagnosis not present

## 2021-06-09 DIAGNOSIS — J438 Other emphysema: Secondary | ICD-10-CM | POA: Diagnosis not present

## 2021-06-09 DIAGNOSIS — J479 Bronchiectasis, uncomplicated: Secondary | ICD-10-CM | POA: Diagnosis not present

## 2021-06-09 DIAGNOSIS — R051 Acute cough: Secondary | ICD-10-CM | POA: Diagnosis not present

## 2021-06-30 ENCOUNTER — Other Ambulatory Visit (HOSPITAL_BASED_OUTPATIENT_CLINIC_OR_DEPARTMENT_OTHER): Payer: Self-pay | Admitting: Family

## 2021-07-14 DIAGNOSIS — H353122 Nonexudative age-related macular degeneration, left eye, intermediate dry stage: Secondary | ICD-10-CM | POA: Diagnosis not present

## 2021-07-14 DIAGNOSIS — H353211 Exudative age-related macular degeneration, right eye, with active choroidal neovascularization: Secondary | ICD-10-CM | POA: Diagnosis not present

## 2021-08-13 DIAGNOSIS — E782 Mixed hyperlipidemia: Secondary | ICD-10-CM | POA: Diagnosis not present

## 2021-08-13 DIAGNOSIS — M810 Age-related osteoporosis without current pathological fracture: Secondary | ICD-10-CM | POA: Diagnosis not present

## 2021-08-13 DIAGNOSIS — M150 Primary generalized (osteo)arthritis: Secondary | ICD-10-CM | POA: Diagnosis not present

## 2021-08-17 NOTE — Progress Notes (Signed)
HPI: FU atrial fibrillation and SVT. Nuclear study August 2018 showed normal perfusion and ejection fraction 72%. Pulmonary function tests October 2018 showed moderately decreased DLCO and restrictive disease. Chest CT September 2018 showed bronchiectasis and pulmonary nodule in the left base. Follow-up recommended 6-12 months. LAD atherosclerosis noted. Now followed by pulmonary. Echo 7/22 showed EF 45-50, grade 1 DD, mild AI. Monitor 7/22 showed sinus with PAF and 5 beats NSVT. Since last seen she has some dyspnea on exertion unchanged.  No orthopnea, PND, pedal edema, syncope, chest pain.  She does feel her heart race transiently.  It is short-lived and improves when she sits down.  Current Outpatient Medications  Medication Sig Dispense Refill   atorvastatin (LIPITOR) 40 MG tablet Take 1 tablet (40 mg total) by mouth daily. 90 tablet 3   diltiazem (CARDIZEM CD) 180 MG 24 hr capsule Take 1 capsule (180 mg total) by mouth daily. 90 capsule 3   losartan (COZAAR) 25 MG tablet Take 1/2 (one-half) tablet by mouth once daily 15 tablet 6   MAGNESIUM SULFATE PO Take 250 mg by mouth daily.     Multiple Vitamin (MULTIVITAMIN) capsule Take 1 capsule by mouth daily.     Respiratory Therapy Supplies (FLUTTER) DEVI Three times a day for cough and congestion 1 each 0   Rivaroxaban (XARELTO) 15 MG TABS tablet Take 1 tablet (15 mg total) by mouth daily. 30 tablet 2   sodium chloride HYPERTONIC 3 % nebulizer solution Take by nebulization as needed for other. 750 mL 12   umeclidinium-vilanterol (ANORO ELLIPTA) 62.5-25 MCG/INH AEPB Inhale 1 puff by mouth once daily 60 each 5   No current facility-administered medications for this visit.     Past Medical History:  Diagnosis Date   Breast cancer ([place])    SVT (supraventricular tachycardia) ([place])     Past Surgical History:  Procedure Laterality Date   BACK SURGERY     MASTECTOMY Right    TONSILLECTOMY     TUBAL LIGATION      Social History    Socioeconomic History   Marital status: Widowed    Spouse name: Not on file   Number of children: 2   Years of education: Not on file   Highest education level: Not on file  Occupational History   Occupation: Retired Therapist, sports  Tobacco Use   Smoking status: Never    Passive exposure: Yes   Smokeless tobacco: Never   Tobacco comments:    Husband and Father   Vaping Use   Vaping Use: Never used  Substance and Sexual Activity   Alcohol use: Yes    Alcohol/week: 0.0 standard drinks    Comment: Occasional   Drug use: No   Sexual activity: Not on file  Other Topics Concern   Not on file  Social History Narrative   [facility] (05/11/17):   Originally from [place]. [place]. Moved to [place] in 2006. Primarily worked as an Therapist, sports in the E.D., PACU, and in nursery. No known TB exposure. No mold, bird, or hot tub exposure. Remotely had a swimming pool. Enjoys exercising and walking. She does have a dog and enjoys gardening.    Social Determinants of Health   Financial Resource Strain: Not on file  Food Insecurity: Not on file  Transportation Needs: Not on file  Physical Activity: Not on file  Stress: Not on file  Social Connections: Not on file  Intimate Partner Violence: Not on file  Family History  Problem Relation Age of Onset   Heart disease Mother    Emphysema Mother        never smoked   Rheum arthritis Mother    Breast cancer Mother    Lung cancer Father    CAD Brother     ROS: no fevers or chills, productive cough, hemoptysis, dysphasia, odynophagia, melena, hematochezia, dysuria, hematuria, rash, seizure activity, orthopnea, PND, pedal edema, claudication. Remaining systems are negative.  Physical Exam: Well-developed well-nourished in no acute distress.  Skin is warm and dry.  HEENT is normal.  Neck is supple.  Chest is clear to auscultation with normal expansion.  Cardiovascular exam is regular rate and rhythm.  Abdominal exam nontender or distended.  No masses palpated. Extremities show no edema. neuro grossly intact  A/P  1 paroxysmal atrial fibrillation-patient is in sinus rhythm on exam today.  Continue Cardizem and Xarelto.  Check hemoglobin and renal function.  She does describe palpitations which could be recurrent atrial fibrillation or SVT.  If this becomes more problematic we can repeat monitor.  She would like to avoid procedures but could consider additional medications if necessary.  2 coronary artery disease-coronary calcification noted on previous CT.  Continue statin.  She denies chest pain.  3 hypertension-blood pressure controlled.  Continue present medications.  4 hyperlipidemia-continue statin.  Check lipids and liver.  5 dyspnea-this is felt secondary to COPD and bronchiectasis.  6 mild LV dysfunction-no change in medications.  Kirk Ruths, MD

## 2021-08-31 ENCOUNTER — Other Ambulatory Visit: Payer: Self-pay

## 2021-08-31 ENCOUNTER — Ambulatory Visit: Payer: Medicare Other | Admitting: Cardiology

## 2021-08-31 ENCOUNTER — Encounter: Payer: Self-pay | Admitting: Cardiology

## 2021-08-31 VITALS — BP 130/76 | HR 74 | Ht 66.5 in | Wt 129.2 lb

## 2021-08-31 DIAGNOSIS — I471 Supraventricular tachycardia: Secondary | ICD-10-CM

## 2021-08-31 DIAGNOSIS — I2510 Atherosclerotic heart disease of native coronary artery without angina pectoris: Secondary | ICD-10-CM

## 2021-08-31 DIAGNOSIS — R0600 Dyspnea, unspecified: Secondary | ICD-10-CM | POA: Diagnosis not present

## 2021-08-31 DIAGNOSIS — I480 Paroxysmal atrial fibrillation: Secondary | ICD-10-CM

## 2021-08-31 DIAGNOSIS — E785 Hyperlipidemia, unspecified: Secondary | ICD-10-CM

## 2021-08-31 LAB — COMPREHENSIVE METABOLIC PANEL
ALT: 18 IU/L (ref 0–32)
AST: 29 IU/L (ref 0–40)
Albumin/Globulin Ratio: 2.1 (ref 1.2–2.2)
Albumin: 4.4 g/dL (ref 3.6–4.6)
Alkaline Phosphatase: 127 IU/L — ABNORMAL HIGH (ref 44–121)
BUN/Creatinine Ratio: 17 (ref 12–28)
BUN: 12 mg/dL (ref 8–27)
Bilirubin Total: 0.7 mg/dL (ref 0.0–1.2)
CO2: 27 mmol/L (ref 20–29)
Calcium: 10 mg/dL (ref 8.7–10.3)
Chloride: 99 mmol/L (ref 96–106)
Creatinine, Ser: 0.7 mg/dL (ref 0.57–1.00)
Globulin, Total: 2.1 g/dL (ref 1.5–4.5)
Glucose: 95 mg/dL (ref 70–99)
Potassium: 4.8 mmol/L (ref 3.5–5.2)
Sodium: 137 mmol/L (ref 134–144)
Total Protein: 6.5 g/dL (ref 6.0–8.5)
eGFR: 84 mL/min/{1.73_m2} (ref >59)

## 2021-08-31 LAB — CBC
Hematocrit: 39.5 % (ref 34.0–46.6)
Hemoglobin: 13.3 g/dL (ref 11.1–15.9)
MCH: 30.2 pg (ref 26.6–33.0)
MCHC: 33.7 g/dL (ref 31.5–35.7)
MCV: 90 fL (ref 79–97)
Platelets: 215 10*3/uL (ref 150–450)
RBC: 4.4 x10E6/uL (ref 3.77–5.28)
RDW: 11.8 % (ref 11.7–15.4)
WBC: 4.5 10*3/uL (ref 3.4–10.8)

## 2021-08-31 LAB — LIPID PANEL
Chol/HDL Ratio: 1.8 ratio (ref 0.0–4.4)
Cholesterol, Total: 137 mg/dL (ref 100–199)
HDL: 75 mg/dL (ref >39)
LDL Chol Calc (NIH): 50 mg/dL (ref 0–99)
Triglycerides: 55 mg/dL (ref 0–149)
VLDL Cholesterol Cal: 12 mg/dL (ref 5–40)

## 2021-08-31 NOTE — Patient Instructions (Signed)

## 2021-09-08 DIAGNOSIS — H353211 Exudative age-related macular degeneration, right eye, with active choroidal neovascularization: Secondary | ICD-10-CM | POA: Diagnosis not present

## 2021-09-23 ENCOUNTER — Other Ambulatory Visit: Payer: Self-pay | Admitting: Pulmonary Disease

## 2021-10-13 DIAGNOSIS — H2513 Age-related nuclear cataract, bilateral: Secondary | ICD-10-CM | POA: Diagnosis not present

## 2021-10-25 DIAGNOSIS — I480 Paroxysmal atrial fibrillation: Secondary | ICD-10-CM | POA: Diagnosis not present

## 2021-10-25 DIAGNOSIS — E782 Mixed hyperlipidemia: Secondary | ICD-10-CM | POA: Diagnosis not present

## 2021-11-01 DIAGNOSIS — D6869 Other thrombophilia: Secondary | ICD-10-CM | POA: Diagnosis not present

## 2021-11-01 DIAGNOSIS — J432 Centrilobular emphysema: Secondary | ICD-10-CM | POA: Diagnosis not present

## 2021-11-01 DIAGNOSIS — I480 Paroxysmal atrial fibrillation: Secondary | ICD-10-CM | POA: Diagnosis not present

## 2021-11-01 DIAGNOSIS — M545 Low back pain, unspecified: Secondary | ICD-10-CM | POA: Diagnosis not present

## 2021-11-01 DIAGNOSIS — E782 Mixed hyperlipidemia: Secondary | ICD-10-CM | POA: Diagnosis not present

## 2021-11-01 DIAGNOSIS — D692 Other nonthrombocytopenic purpura: Secondary | ICD-10-CM | POA: Diagnosis not present

## 2021-11-03 ENCOUNTER — Other Ambulatory Visit: Payer: Self-pay | Admitting: Cardiology

## 2021-11-17 DIAGNOSIS — H353211 Exudative age-related macular degeneration, right eye, with active choroidal neovascularization: Secondary | ICD-10-CM | POA: Diagnosis not present

## 2021-11-24 DIAGNOSIS — L821 Other seborrheic keratosis: Secondary | ICD-10-CM | POA: Diagnosis not present

## 2021-11-24 DIAGNOSIS — D692 Other nonthrombocytopenic purpura: Secondary | ICD-10-CM | POA: Diagnosis not present

## 2021-11-24 DIAGNOSIS — L819 Disorder of pigmentation, unspecified: Secondary | ICD-10-CM | POA: Diagnosis not present

## 2021-11-24 DIAGNOSIS — Z85828 Personal history of other malignant neoplasm of skin: Secondary | ICD-10-CM | POA: Diagnosis not present

## 2021-11-24 DIAGNOSIS — L814 Other melanin hyperpigmentation: Secondary | ICD-10-CM | POA: Diagnosis not present

## 2021-11-24 DIAGNOSIS — D0362 Melanoma in situ of left upper limb, including shoulder: Secondary | ICD-10-CM | POA: Diagnosis not present

## 2021-11-24 DIAGNOSIS — D225 Melanocytic nevi of trunk: Secondary | ICD-10-CM | POA: Diagnosis not present

## 2021-11-24 DIAGNOSIS — D485 Neoplasm of uncertain behavior of skin: Secondary | ICD-10-CM | POA: Diagnosis not present

## 2021-12-16 ENCOUNTER — Ambulatory Visit: Payer: Medicare Other | Admitting: Pulmonary Disease

## 2021-12-16 ENCOUNTER — Encounter: Payer: Self-pay | Admitting: Pulmonary Disease

## 2021-12-16 VITALS — BP 128/68 | HR 80 | Temp 98.3°F | Ht 66.05 in | Wt 126.8 lb

## 2021-12-16 DIAGNOSIS — J479 Bronchiectasis, uncomplicated: Secondary | ICD-10-CM

## 2021-12-16 DIAGNOSIS — J439 Emphysema, unspecified: Secondary | ICD-10-CM | POA: Diagnosis not present

## 2021-12-16 MED ORDER — ALBUTEROL SULFATE HFA 108 (90 BASE) MCG/ACT IN AERS: 2.0000 | INHALATION_SPRAY | Freq: Four times a day (QID) | RESPIRATORY_TRACT | 6 refills | Status: DC | PRN

## 2021-12-16 NOTE — Patient Instructions (Signed)
Glad you are doing well with your respiratory symptoms ?You can use the albuterol rescue inhaler for intermittent shortness of breath ?Continue using the nebulizer and flutter valve ?We will order high-res CT chest in 1 year and follow-up in clinic in 1 year ?

## 2021-12-16 NOTE — Progress Notes (Signed)
? ?      ?Nichole Lawrence    [id number]    09/23/1932 ? ?Primary Care Physician:Ramachandran, Ajith, MD ? ?Referring Physician: Ramachandran, Ajith, MD ?[address] ?[place],  [place] [address] ? ?Chief complaint: Follow-up for emphysema, bronchiectasis, lung nodules. ? ?HPI: ?88-year-old with history of pulmonary emphysema, bronchiectasis, pulmonary nodules.  Previously followed by Dr. Nestor ?She is a non-smoker with exposure to secondhand smoke.  No evidence of alpha-1 antitrypsin deficiency.  Seen in pulmonary clinic in May 2019 with pneumonia, bronchiectasis exacerbation. CT at that time showed worsening pulmonary opacities.  However follow-up chest x-ray showed improvement after treatment with antibiotics ? ?Pets: Dog, no cats, birds. 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 ?Exposures: No known exposures, no mold, hot tub, Jacuzzi ?Smoking history: Never smoker exposed to secondhand smoke  ?Travel history: Originally from [place]. ?Relevant family history: Father had COPD.  He was a smoker. ? ?Interim history: ?Continues on anoro inhaler ?Note some mild increase in dyspnea as she is working outside in the garden in the pollen season ?Denies any cough, sputum production, fevers, chills. ? ?Using the flutter valve and nebulizers several times a week but not daily.  She does not have cough or mucus production ? ?Outpatient Encounter Medications as of 12/16/2021  ?Medication Sig  ? atorvastatin (LIPITOR) 40 MG tablet Take 1 tablet (40 mg total) by mouth daily.  ? diltiazem (CARDIZEM CD) 180 MG 24 hr capsule Take 1 capsule by mouth once daily  ? losartan (COZAAR) 25 MG tablet Take 1/2 (one-half) tablet by mouth once daily  ? MAGNESIUM SULFATE PO Take 250 mg by mouth daily.  ? Multiple Vitamin (MULTIVITAMIN) capsule Take 1 capsule by mouth daily.  ? Respiratory Therapy Supplies (FLUTTER) DEVI Three times a day for cough and congestion  ? sodium chloride HYPERTONIC 3 % nebulizer  solution Take by nebulization as needed for other.  ? umeclidinium-vilanterol (ANORO ELLIPTA) 62.5-25 MCG/ACT AEPB Inhale 1 puff by mouth once daily  ? Rivaroxaban (XARELTO) 15 MG TABS tablet Take 1 tablet (15 mg total) by mouth daily.  ? ?No facility-administered encounter medications on file as of 12/16/2021.  ? ?Physical Exam: ?Blood pressure 128/68, pulse 80, temperature 98.3 ?F (36.8 ?C), temperature source Oral, height 5' 6.05" (1.678 m), weight 126 lb 12.8 oz (57.5 kg), SpO2 96 %. ?Gen:      No acute distress ?HEENT:  EOMI, sclera anicteric ?Neck:     No masses; no thyromegaly ?Lungs:    Clear to auscultation bilaterally; normal respiratory effort ?CV:         Regular rate and rhythm; no murmurs ?Abd:      + bowel sounds; soft, non-tender; no palpable masses, no distension ?Ext:    No edema; adequate peripheral perfusion ?Skin:      Warm and dry; no rash ?Neuro: alert and oriented x 3 ?Psych: normal mood and affect  ? ?Data Reviewed: ?Imaging ?CT CHEST W/ CONTRAST 07/01/15   nodularity noted in bilateral lungs and multiple lobes upper, middle, lingula, and lower. No pathologic mediastinal adenopathy. Largest nodule measures 1 cm in right upper lobe with some spiculation. No pleural effusion or thickening. No pericardial effusion. very mild apical predominant emphysema. Mild pectus excavatum.  ? ?CT CHEST W/O 05/12/17   New 6 mm nodule in left lower lobe. Diffuse bronchial wall thickening with findings consistent with bronchiectasis and right middle and upper lobes bilaterally. Tree-in-bud opacities predominantly within right upper and right   middle lobes. No pleural effusion or thickening. No pericardial effusion. No pathologic mediastinal adenopathy. ?  ?CT chest without contrast 12/19/2017- 17 but opacity, patchy consolidation, groundglass attenuation, nodularity, bronchiectasis.  Stable 6 mm left lower lobe nodule. ? ?CT high-resolution 02/21/2019- no interstitial lung disease.  Nodules, tree-in-bud bilaterally.   More than the right upper and middle lobe.  Improved compared to prior. ? ?CT chest 05/20/2021-Spectrum findings with chronic nodularity, tree-in-bud, bronchiectasis ?Slightly worsened ?I have reviewed the images personally. ? ?PFT ?06/12/17: FVC 2.13 L (74%) FEV1 1.76 L (82%) FEV1/FVC 0.83 FEF 25-75 1.70 L (118%) positive bronchodilator response TLC 5.67 L (103%) RV 134% ERV 80% DLCO corrected 53% ?07/30/15: FVC 2.68 L (90%) FEV1 1.94 L (88%) FEV1/FVC 0.72 FEF 25-75 1.32 L (85%) negative bronchodilator response TLC 6.49 L (117%) RV 149% ERV 117% DLCO uncorrected 54% ? ?LABS ?05/11/17 ?Alpha-1 antitrypsin: MM (154) ?ESR: 3 ?CRP: 0.3 ?ANA:  1:160, speckled ?RA:  <14 ?Anti-CCP:  <16 ?  ?Assessment:  ?Pulmonary emphysema ?Doing well on Anoro.  Continue the same. ?Start using albuterol as needed for intermittent shortness of breath ? ?Allergic rhinitis with postnasal drip ?Symptoms for the past week consistent with allergic rhinitis with postnasal drip ?Advised over-the-counter steroid nasal spray and antihistamine. ?  ?Bronchiectasis ?She had an exacerbation in May 2019 but since then has remained stable ?Currently is not making any sputum hence not be tested for cultures to rule out MAI ?Has declined bronchoscopy in the past.  We will continue to monitor her symptoms for any recurrence. ? ?Using flutter valve and hypertonic saline intermittently ? ?Follow-up CT chest in 1 year ? ?Health maintenance ?07/04/2013- Prevnar ?09/27/2016-Pneumovax ? ?She is up-to-date with Covid vaccination ? ?Plan/Recommendations: ?- Continue Anoro, albuterol ?- OTC steroid nasal spray, antihistamine ?- CT chest without contrast in 1 year ? ?[name] MD ?[facility] ?12/16/2021, 9:40 AM ? ?CC: Merrilee Seashore, MD ? ? ?

## 2021-12-22 DIAGNOSIS — H353211 Exudative age-related macular degeneration, right eye, with active choroidal neovascularization: Secondary | ICD-10-CM | POA: Diagnosis not present

## 2021-12-29 ENCOUNTER — Other Ambulatory Visit: Payer: Self-pay | Admitting: Pulmonary Disease

## 2022-01-06 DIAGNOSIS — D0362 Melanoma in situ of left upper limb, including shoulder: Secondary | ICD-10-CM | POA: Diagnosis not present

## 2022-02-04 ENCOUNTER — Other Ambulatory Visit (HOSPITAL_BASED_OUTPATIENT_CLINIC_OR_DEPARTMENT_OTHER): Payer: Self-pay | Admitting: Family

## 2022-02-04 ENCOUNTER — Other Ambulatory Visit: Payer: Self-pay | Admitting: Cardiology

## 2022-02-09 DIAGNOSIS — H353211 Exudative age-related macular degeneration, right eye, with active choroidal neovascularization: Secondary | ICD-10-CM | POA: Diagnosis not present

## 2022-02-28 DIAGNOSIS — D181 Lymphangioma, any site: Secondary | ICD-10-CM | POA: Diagnosis not present

## 2022-02-28 DIAGNOSIS — D485 Neoplasm of uncertain behavior of skin: Secondary | ICD-10-CM | POA: Diagnosis not present

## 2022-03-04 ENCOUNTER — Telehealth: Payer: Self-pay | Admitting: Cardiology

## 2022-03-04 ENCOUNTER — Other Ambulatory Visit: Payer: Self-pay

## 2022-03-04 MED ORDER — RIVAROXABAN 15 MG PO TABS
15.0000 mg | ORAL_TABLET | Freq: Every day | ORAL | 2 refills | Status: DC
Start: 2022-03-04 — End: 2022-06-06

## 2022-03-04 NOTE — Telephone Encounter (Signed)
Prescription refill request for Xarelto received.  Indication:CVA Last office visit:1/23 Weight:57.5 kg Age:88 Scr:0.7 CrCl:51.4 ml/min  Prescription refilled

## 2022-03-04 NOTE — Telephone Encounter (Signed)
*  STAT* If patient is at the pharmacy, call can be transferred to refill team.   1. Which medications need to be refilled? (please list name of each medication and dose if known) Rivaroxaban (XARELTO) 15 MG TABS tablet  2. Which pharmacy/location (including street and city if local pharmacy) is medication to be sent to? [place] [place], [place] - [address]  3. Do they need a 30 day or 90 day supply? 90 day  Patient is out of medication.

## 2022-03-18 NOTE — Progress Notes (Signed)
HPI: FU atrial fibrillation and SVT. Nuclear study August 2018 showed normal perfusion and ejection fraction 72%. Pulmonary function tests October 2018 showed moderately decreased DLCO and restrictive disease. Chest CT September 2018 showed bronchiectasis and pulmonary nodule in the left base. Follow-up recommended 6-12 months. LAD atherosclerosis noted. Now followed by pulmonary. Echo 7/22 showed EF 45-50, grade 1 DD, mild AI. Monitor 7/22 showed sinus with PAF and 5 beats NSVT. Since last seen she has dyspnea with more vigorous activities.  She does not have exertional chest pain.  Occasional palpitations that resolved.  She has not fallen.  No bleeding but she does have some bruising.  Current Outpatient Medications  Medication Sig Dispense Refill   albuterol (VENTOLIN HFA) 108 (90 Base) MCG/ACT inhaler Inhale 2 puffs into the lungs every 6 (six) hours as needed for wheezing or shortness of breath. 8 g 6   atorvastatin (LIPITOR) 40 MG tablet Take 1 tablet (40 mg total) by mouth daily. 90 tablet 3   diltiazem (CARDIZEM CD) 180 MG 24 hr capsule Take 1 capsule by mouth once daily 90 capsule 1   losartan (COZAAR) 25 MG tablet Take 1/2 (one-half) tablet by mouth once daily 15 tablet 2   MAGNESIUM SULFATE PO Take 250 mg by mouth daily.     Multiple Vitamin (MULTIVITAMIN) capsule Take 1 capsule by mouth daily.     Respiratory Therapy Supplies (FLUTTER) DEVI Three times a day for cough and congestion 1 each 0   Rivaroxaban (XARELTO) 15 MG TABS tablet Take 1 tablet (15 mg total) by mouth daily. 30 tablet 2   sodium chloride HYPERTONIC 3 % nebulizer solution Take by nebulization as needed for other. 750 mL 12   umeclidinium-vilanterol (ANORO ELLIPTA) 62.5-25 MCG/ACT AEPB Inhale 1 puff by mouth once daily 60 each 5   No current facility-administered medications for this visit.     Past Medical History:  Diagnosis Date   Breast cancer ([place])    SVT (supraventricular tachycardia) ([place])      Past Surgical History:  Procedure Laterality Date   BACK SURGERY     MASTECTOMY Right    TONSILLECTOMY     TUBAL LIGATION      Social History   Socioeconomic History   Marital status: Widowed    Spouse name: Not on file   Number of children: 2   Years of education: Not on file   Highest education level: Not on file  Occupational History   Occupation: Retired Therapist, sports  Tobacco Use   Smoking status: Never    Passive exposure: Yes   Smokeless tobacco: Never   Tobacco comments:    Husband and Father   Vaping Use   Vaping Use: Never used  Substance and Sexual Activity   Alcohol use: Yes    Alcohol/week: 0.0 standard drinks of alcohol    Comment: Occasional   Drug use: No   Sexual activity: Not on file  Other Topics Concern   Not on file  Social History Narrative   [facility] (05/11/17):   Originally from [place]. [place]ived near a paper [place]. Moved to [place] in 2006. Primarily worked as an Therapist, sports in the E.D., PACU, and in nursery. No known TB exposure. No mold, bird, or hot tub exposure. Remotely had a swimming pool. Enjoys exercising and walking. She does have a dog and enjoys gardening.    Social Determinants of Health   Financial Resource Strain: Not on file  Food Insecurity: Not on file  Transportation Needs: Not on file  Physical Activity: Not on file  Stress: Not on file  Social Connections: Not on file  Intimate Partner Violence: Not on file    Family History  Problem Relation Age of Onset   Heart disease Mother    Emphysema Mother        never smoked   Rheum arthritis Mother    Breast cancer Mother    Lung cancer Father    CAD Brother     ROS: no fevers or chills, productive cough, hemoptysis, dysphasia, odynophagia, melena, hematochezia, dysuria, hematuria, rash, seizure activity, orthopnea, PND, pedal edema, claudication. Remaining systems are negative.  Physical Exam: Well-developed well-nourished in no acute distress.  Skin is warm and dry.  HEENT is  normal.  Neck is supple.  Chest is clear to auscultation with normal expansion.  Cardiovascular exam is regular rate and rhythm.  Abdominal exam nontender or distended. No masses palpated. Extremities show no edema. neuro grossly intact  ECG-normal sinus rhythm, first-degree AV block, left anterior fascicular block, cannot rule out septal infarct.  Personally reviewed  A/P  1 paroxysmal atrial fibrillation-patient remains in sinus rhythm.  We will continue Cardizem for rate control if atrial fibrillation recurs.  Continue Xarelto.  Can consider addition of antiarrhythmic in the future if she has more frequent episodes.  Hemoglobin and renal function monitored by primary care.  2 coronary artery disease-based on previous coronary calcification noted on CT scan.  Continue statin.  3 hypertension-patient's blood pressure is controlled.  Continue present medical regimen.  4 hyperlipidemia-continue statin.  Lipids and liver monitored by primary care.  5 history of mild LV dysfunction-repeat echo.  6 dyspnea-felt secondary to a combination of COPD and bronchiectasis.  Kirk Ruths, MD

## 2022-03-23 DIAGNOSIS — H401230 Low-tension glaucoma, bilateral, stage unspecified: Secondary | ICD-10-CM | POA: Diagnosis not present

## 2022-03-23 DIAGNOSIS — H353122 Nonexudative age-related macular degeneration, left eye, intermediate dry stage: Secondary | ICD-10-CM | POA: Diagnosis not present

## 2022-03-23 DIAGNOSIS — H353211 Exudative age-related macular degeneration, right eye, with active choroidal neovascularization: Secondary | ICD-10-CM | POA: Diagnosis not present

## 2022-03-29 ENCOUNTER — Ambulatory Visit: Payer: Medicare Other | Admitting: Cardiology

## 2022-03-29 ENCOUNTER — Encounter: Payer: Self-pay | Admitting: Cardiology

## 2022-03-29 VITALS — BP 122/68 | HR 71 | Ht 66.0 in | Wt 125.2 lb

## 2022-03-29 DIAGNOSIS — I2510 Atherosclerotic heart disease of native coronary artery without angina pectoris: Secondary | ICD-10-CM | POA: Diagnosis not present

## 2022-03-29 DIAGNOSIS — E785 Hyperlipidemia, unspecified: Secondary | ICD-10-CM

## 2022-03-29 DIAGNOSIS — R0600 Dyspnea, unspecified: Secondary | ICD-10-CM

## 2022-03-29 DIAGNOSIS — I471 Supraventricular tachycardia: Secondary | ICD-10-CM

## 2022-03-29 DIAGNOSIS — I4891 Unspecified atrial fibrillation: Secondary | ICD-10-CM | POA: Diagnosis not present

## 2022-03-29 DIAGNOSIS — I480 Paroxysmal atrial fibrillation: Secondary | ICD-10-CM

## 2022-03-29 NOTE — Patient Instructions (Signed)
Medication Instructions:  No changes *If you need a refill on your cardiac medications before your next appointment, please call your pharmacy*   Lab Work: None ordered If you have labs (blood work) drawn today and your tests are completely normal, you will receive your results only by: [place] (if you have MyChart) OR A paper copy in the mail If you have any lab test that is abnormal or we need to change your treatment, we will call you to review the results.   Testing/Procedures: Your physician has requested that you have an echocardiogram. Echocardiography is a painless test that uses sound waves to create images of your heart. It provides your doctor with information about the size and shape of your heart and how well your heart's chambers and valves are working. You may receive an ultrasound enhancing agent through an IV if needed to better visualize your heart during the echo.This procedure takes approximately one hour. There are no restrictions for this procedure. This will take place at the [address], [address].     Follow-Up: At [facility], you and your health needs are our priority.  As part of our continuing mission to provide you with exceptional heart care, we have created designated Provider Care Teams.  These Care Teams include your primary Cardiologist (physician) and Advanced Practice Providers (APPs -  Physician Assistants and Nurse Practitioners) who all work together to provide you with the care you need, when you need it.  We recommend signing up for the patient portal called "MyChart".  Sign up information is provided on this After Visit Summary.  MyChart is used to connect with patients for Virtual Visits (Telemedicine).  Patients are able to view lab/test results, encounter notes, upcoming appointments, etc.  Non-urgent messages can be sent to your provider as well.   To learn more about what you can do with MyChart, go to [email or web address].     Your next appointment:   12 month(s)  The format for your next appointment:   In Person  Provider:   Kirk Ruths, MD {    Important Information About [place]

## 2022-04-08 ENCOUNTER — Encounter: Payer: Self-pay | Admitting: Cardiology

## 2022-04-08 ENCOUNTER — Ambulatory Visit (HOSPITAL_COMMUNITY): Payer: Medicare Other | Attending: Cardiology

## 2022-04-08 DIAGNOSIS — I4891 Unspecified atrial fibrillation: Secondary | ICD-10-CM | POA: Insufficient documentation

## 2022-04-08 LAB — ECHOCARDIOGRAM COMPLETE
Area-P 1/2: 3.07 cm2
P 1/2 time: 482 msec
S' Lateral: 2.5 cm

## 2022-04-12 DIAGNOSIS — I480 Paroxysmal atrial fibrillation: Secondary | ICD-10-CM | POA: Diagnosis not present

## 2022-04-12 DIAGNOSIS — D692 Other nonthrombocytopenic purpura: Secondary | ICD-10-CM | POA: Diagnosis not present

## 2022-04-12 DIAGNOSIS — R5383 Other fatigue: Secondary | ICD-10-CM | POA: Diagnosis not present

## 2022-04-12 DIAGNOSIS — E782 Mixed hyperlipidemia: Secondary | ICD-10-CM | POA: Diagnosis not present

## 2022-04-12 DIAGNOSIS — J432 Centrilobular emphysema: Secondary | ICD-10-CM | POA: Diagnosis not present

## 2022-04-19 DIAGNOSIS — J432 Centrilobular emphysema: Secondary | ICD-10-CM | POA: Diagnosis not present

## 2022-04-19 DIAGNOSIS — Z0000 Encounter for general adult medical examination without abnormal findings: Secondary | ICD-10-CM | POA: Diagnosis not present

## 2022-04-19 DIAGNOSIS — I480 Paroxysmal atrial fibrillation: Secondary | ICD-10-CM | POA: Diagnosis not present

## 2022-04-19 DIAGNOSIS — D6869 Other thrombophilia: Secondary | ICD-10-CM | POA: Diagnosis not present

## 2022-04-19 DIAGNOSIS — M150 Primary generalized (osteo)arthritis: Secondary | ICD-10-CM | POA: Diagnosis not present

## 2022-04-19 DIAGNOSIS — I700 Atherosclerosis of aorta: Secondary | ICD-10-CM | POA: Diagnosis not present

## 2022-04-19 DIAGNOSIS — M810 Age-related osteoporosis without current pathological fracture: Secondary | ICD-10-CM | POA: Diagnosis not present

## 2022-04-19 DIAGNOSIS — D692 Other nonthrombocytopenic purpura: Secondary | ICD-10-CM | POA: Diagnosis not present

## 2022-04-19 DIAGNOSIS — C4362 Malignant melanoma of left upper limb, including shoulder: Secondary | ICD-10-CM | POA: Diagnosis not present

## 2022-04-19 DIAGNOSIS — E782 Mixed hyperlipidemia: Secondary | ICD-10-CM | POA: Diagnosis not present

## 2022-05-04 ENCOUNTER — Other Ambulatory Visit (HOSPITAL_BASED_OUTPATIENT_CLINIC_OR_DEPARTMENT_OTHER): Payer: Self-pay | Admitting: Family

## 2022-05-04 NOTE — Telephone Encounter (Signed)
Rx(s) sent to pharmacy electronically.  

## 2022-05-18 DIAGNOSIS — H353122 Nonexudative age-related macular degeneration, left eye, intermediate dry stage: Secondary | ICD-10-CM | POA: Diagnosis not present

## 2022-05-18 DIAGNOSIS — H04123 Dry eye syndrome of bilateral lacrimal glands: Secondary | ICD-10-CM | POA: Diagnosis not present

## 2022-05-18 DIAGNOSIS — H353211 Exudative age-related macular degeneration, right eye, with active choroidal neovascularization: Secondary | ICD-10-CM | POA: Diagnosis not present

## 2022-06-06 ENCOUNTER — Other Ambulatory Visit: Payer: Self-pay | Admitting: Cardiology

## 2022-06-06 NOTE — Telephone Encounter (Signed)
Prescription refill request for Xarelto received.  Indication:cva Last office visit:8/23 Weight:56.8 kg Age:88 Scr:0.7 CrCl:50.77  ml/min  Prescription refilled

## 2022-07-10 ENCOUNTER — Other Ambulatory Visit (HOSPITAL_BASED_OUTPATIENT_CLINIC_OR_DEPARTMENT_OTHER): Payer: Self-pay | Admitting: Family

## 2022-07-10 DIAGNOSIS — I480 Paroxysmal atrial fibrillation: Secondary | ICD-10-CM

## 2022-07-10 DIAGNOSIS — E785 Hyperlipidemia, unspecified: Secondary | ICD-10-CM

## 2022-07-11 NOTE — Telephone Encounter (Signed)
Rx(s) sent to pharmacy electronically.  

## 2022-07-13 DIAGNOSIS — H353211 Exudative age-related macular degeneration, right eye, with active choroidal neovascularization: Secondary | ICD-10-CM | POA: Diagnosis not present

## 2022-08-10 ENCOUNTER — Other Ambulatory Visit: Payer: Self-pay | Admitting: Cardiology

## 2022-08-17 ENCOUNTER — Other Ambulatory Visit: Payer: Self-pay | Admitting: Pulmonary Disease

## 2022-09-07 DIAGNOSIS — H353122 Nonexudative age-related macular degeneration, left eye, intermediate dry stage: Secondary | ICD-10-CM | POA: Diagnosis not present

## 2022-09-07 DIAGNOSIS — H353211 Exudative age-related macular degeneration, right eye, with active choroidal neovascularization: Secondary | ICD-10-CM | POA: Diagnosis not present

## 2022-09-07 DIAGNOSIS — H04123 Dry eye syndrome of bilateral lacrimal glands: Secondary | ICD-10-CM | POA: Diagnosis not present

## 2022-10-18 DIAGNOSIS — H353121 Nonexudative age-related macular degeneration, left eye, early dry stage: Secondary | ICD-10-CM | POA: Diagnosis not present

## 2022-11-02 DIAGNOSIS — H353211 Exudative age-related macular degeneration, right eye, with active choroidal neovascularization: Secondary | ICD-10-CM | POA: Diagnosis not present

## 2022-12-08 ENCOUNTER — Other Ambulatory Visit: Payer: Self-pay | Admitting: Cardiology

## 2022-12-08 NOTE — Telephone Encounter (Signed)
Sent in for refill. Called pharmacy and cancelled refill because she is overdue for blood work. Called pt who stated that she has 3 tablets left of Xarelto and had blood work done at PCP.   Informed pt that I will call PCP to see if I can get blood work.

## 2022-12-08 NOTE — Telephone Encounter (Addendum)
Prescription refill request for Xarelto received.  Indication: afib  Last office visit: [place], 03/29/2022 Weight: 56.8 kg  Age: 89 yo  Scr: 0.70, 08/31/2021 CrCl: 50 ml/min

## 2022-12-08 NOTE — Telephone Encounter (Signed)
Yes would continue current dose since she's right on the borderline

## 2022-12-09 MED ORDER — RIVAROXABAN 15 MG PO TABS: 15.0000 mg | ORAL_TABLET | Freq: Every day | ORAL | 5 refills | Status: DC

## 2022-12-09 NOTE — Telephone Encounter (Addendum)
Scr: 0.76, 04/19/2022 Weight: 56.8 kg Age: 89 yo  CrCl: 46 ml/min   Received blood work with in the past year. Sent in refill for Xarelto 15 mg daily. Pt is on appropriate dose.

## 2022-12-09 NOTE — Addendum Note (Signed)
Addended by: Mellody Dance B on: 12/09/2022 10:38 AM   Modules accepted: Orders

## 2022-12-13 ENCOUNTER — Ambulatory Visit (HOSPITAL_BASED_OUTPATIENT_CLINIC_OR_DEPARTMENT_OTHER)
Admission: RE | Admit: 2022-12-13 | Discharge: 2022-12-13 | Disposition: A | Discharge status: Home / Self Care | Payer: Medicare Other | Source: Ambulatory Visit | Attending: Pulmonary Disease | Admitting: Pulmonary Disease

## 2022-12-13 ENCOUNTER — Encounter (HOSPITAL_BASED_OUTPATIENT_CLINIC_OR_DEPARTMENT_OTHER): Payer: Self-pay

## 2022-12-13 DIAGNOSIS — J439 Emphysema, unspecified: Secondary | ICD-10-CM | POA: Insufficient documentation

## 2022-12-13 DIAGNOSIS — R918 Other nonspecific abnormal finding of lung field: Secondary | ICD-10-CM | POA: Diagnosis not present

## 2022-12-13 DIAGNOSIS — J479 Bronchiectasis, uncomplicated: Secondary | ICD-10-CM | POA: Diagnosis not present

## 2022-12-23 ENCOUNTER — Ambulatory Visit: Payer: Medicare Other | Admitting: Pulmonary Disease

## 2022-12-23 ENCOUNTER — Encounter: Payer: Self-pay | Admitting: Pulmonary Disease

## 2022-12-23 VITALS — BP 128/78 | HR 79 | Temp 97.7°F | Ht 67.0 in | Wt 122.4 lb

## 2022-12-23 DIAGNOSIS — J438 Other emphysema: Secondary | ICD-10-CM

## 2022-12-23 DIAGNOSIS — J439 Emphysema, unspecified: Secondary | ICD-10-CM

## 2022-12-23 DIAGNOSIS — J479 Bronchiectasis, uncomplicated: Secondary | ICD-10-CM | POA: Diagnosis not present

## 2022-12-23 MED ORDER — BREZTRI AEROSPHERE 160-9-4.8 MCG/ACT IN AERO: 2.0000 | INHALATION_SPRAY | Freq: Two times a day (BID) | RESPIRATORY_TRACT | 3 refills | Status: DC

## 2022-12-23 MED ORDER — SODIUM CHLORIDE 3 % IN NEBU
INHALATION_SOLUTION | RESPIRATORY_TRACT | 12 refills | Status: DC | PRN
Start: 2022-12-23 — End: 2023-03-09

## 2022-12-23 NOTE — Progress Notes (Signed)
Nichole Lawrence    [id number]    [birth year]  Primary Care Physician:Ramachandran, Campbell Lerner, MD  Referring Physician: Georgianne Fick, MD [address] [place],  [place] [address]  Chief complaint: Follow-up for emphysema, bronchiectasis, lung nodules.  HPI: 89 y.o.  with history of pulmonary emphysema, bronchiectasis, pulmonary nodules.  Previously followed by Dr. Jamison Neighbor She is a non-smoker with exposure to secondhand smoke.  No evidence of alpha-1 antitrypsin deficiency.  Seen in pulmonary clinic in May 2019 with pneumonia, bronchiectasis exacerbation. CT at that time showed worsening pulmonary opacities.  However follow-up chest x-ray showed improvement after treatment with antibiotics  Pets: Dog, no cats, birds. 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 Exposures: No known exposures, no mold, hot tub, Jacuzzi Smoking history: Never smoker exposed to secondhand smoke  Travel history: Originally from [place]. Relevant family history: Father had COPD.  He was a smoker.  Interim history: Continues on anoro inhaler Note some mild increase in dyspnea as she is working outside in the garden in the pollen season Denies any cough, sputum production, fevers, chills.  She is using the flutter valve but stopped using the hypertonic saline nebulizers Here for review of CT scan.  Outpatient Encounter Medications as of 12/23/2022  Medication Sig   albuterol (VENTOLIN HFA) 108 (90 Base) MCG/ACT inhaler Inhale 2 puffs into the lungs every 6 (six) hours as needed for wheezing or shortness of breath.   atorvastatin (LIPITOR) 40 MG tablet Take 1 tablet by mouth once daily   diltiazem (CARDIZEM CD) 180 MG 24 hr capsule Take 1 capsule by mouth once daily   losartan (COZAAR) 25 MG tablet Take 1/2 (one-half) tablet by mouth once daily   MAGNESIUM SULFATE PO Take 250 mg by mouth daily.   Multiple Vitamin (MULTIVITAMIN) capsule Take 1 capsule by mouth daily.    Respiratory Therapy Supplies (FLUTTER) DEVI Three times a day for cough and congestion   Rivaroxaban (XARELTO) 15 MG TABS tablet Take 1 tablet (15 mg total) by mouth daily.   umeclidinium-vilanterol (ANORO ELLIPTA) 62.5-25 MCG/ACT AEPB Inhale 1 puff by mouth once daily   sodium chloride HYPERTONIC 3 % nebulizer solution Take by nebulization as needed for other. (Patient not taking: Reported on 12/23/2022)   No facility-administered encounter medications on file as of 12/23/2022.   Physical Exam: Blood pressure 128/78, pulse 79, temperature 97.7 F (36.5 C), temperature source Oral, height 5\' 7"  (1.702 m), weight 122 lb 6.4 oz (55.5 kg), SpO2 95 %. Gen:      No acute distress HEENT:  EOMI, sclera anicteric Neck:     No masses; no thyromegaly Lungs:    Clear to auscultation bilaterally; normal respiratory effort CV:         Regular rate and rhythm; no murmurs Abd:      + bowel sounds; soft, non-tender; no palpable masses, no distension Ext:    No edema; adequate peripheral perfusion Skin:      Warm and dry; no rash Neuro: alert and oriented x 3 Psych: normal mood and affect   Data Reviewed: Imaging CT CHEST W/ CONTRAST 07/01/15   nodularity noted in bilateral lungs and multiple lobes upper, middle, lingula, and lower. No pathologic mediastinal adenopathy. Largest nodule measures 1 cm in right upper lobe with some spiculation. No pleural effusion or thickening. No pericardial effusion. very mild apical predominant emphysema. Mild pectus excavatum.   CT CHEST W/O 05/12/17   New  6 mm nodule in left lower lobe. Diffuse bronchial wall thickening with findings consistent with bronchiectasis and right middle and upper lobes bilaterally. Tree-in-bud opacities predominantly within right upper and right middle lobes. No pleural effusion or thickening. No pericardial effusion. No pathologic mediastinal adenopathy.   CT chest without contrast 12/19/2017- 17 but opacity, patchy consolidation, groundglass  attenuation, nodularity, bronchiectasis.  Stable 6 mm left lower lobe nodule.  CT high-resolution 02/21/2019- no interstitial lung disease.  Nodules, tree-in-bud bilaterally.  More than the right upper and middle lobe.  Improved compared to prior.  CT chest 05/20/2021-Spectrum findings with chronic nodularity, tree-in-bud, bronchiectasis Slightly worsened  CT high-resolution 12/13/2022-slight increase in bronchiectasis, peribronchovascular nodularity, tree-in-bud. I have reviewed the images personally.  PFT 06/12/17: FVC 2.13 L (74%) FEV1 1.76 L (82%) FEV1/FVC 0.83 FEF 25-75 1.70 L (118%) positive bronchodilator response TLC 5.67 L (103%) RV 134% ERV 80% DLCO corrected 53% 07/30/15: FVC 2.68 L (90%) FEV1 1.94 L (88%) FEV1/FVC 0.72 FEF 25-75 1.32 L (85%) negative bronchodilator response TLC 6.49 L (117%) RV 149% ERV 117% DLCO uncorrected 54%  LABS 05/11/17 Alpha-1 antitrypsin: MM (154) ESR: 3 CRP: 0.3 ANA:  1:160, speckled RA:  <14 Anti-CCP:  <16   Assessment:  Pulmonary emphysema Has increase in dyspnea.  Will stop the Anoro and start Breztri inhaler  Allergic rhinitis with postnasal drip  Symptoms for the past week consistent with allergic rhinitis with postnasal drip Advised over-the-counter steroid nasal spray and antihistamine.   Bronchiectasis CT reviewed with slight progressive increase in bronchiectasis and tree-in-bud Currently is not making any sputum hence not be tested for cultures to rule out MAI Has declined bronchoscopy in the past.  Will try to avoid starting treatment due to age and frailty  I asked her to start reusing the hypertonic saline.  Continue flutter valve, Mucinex Schedule PFTs for reassessment   Health maintenance 07/04/2013- Prevnar 09/27/2016-Pneumovax  She is up-to-date with Covid vaccination  Plan/Recommendations: - Change Anoro to [employer] - Flutter valve, Mucinex - PFTs and follow-up in 3 months - OTC steroid nasal spray,  antihistamine  Chilton Greathouse MD [facility] 12/23/2022, 9:51 AM  CC: Georgianne Fick, MD

## 2022-12-23 NOTE — Patient Instructions (Signed)
Will reorder the hypertonic saline twice daily Use of flutter valve and Mucinex over-the-counter for mucociliary clearance Will stop the Anoro and start you on an inhaler called Breztri and send your prescription Schedule PFTs in 2 to 3 months Return to clinic after PFTs

## 2022-12-26 ENCOUNTER — Encounter: Payer: Self-pay | Admitting: Pulmonary Disease

## 2023-01-04 DIAGNOSIS — H353211 Exudative age-related macular degeneration, right eye, with active choroidal neovascularization: Secondary | ICD-10-CM | POA: Diagnosis not present

## 2023-01-06 DIAGNOSIS — L720 Epidermal cyst: Secondary | ICD-10-CM | POA: Diagnosis not present

## 2023-01-06 DIAGNOSIS — Z85820 Personal history of malignant melanoma of skin: Secondary | ICD-10-CM | POA: Diagnosis not present

## 2023-01-06 DIAGNOSIS — L820 Inflamed seborrheic keratosis: Secondary | ICD-10-CM | POA: Diagnosis not present

## 2023-01-06 DIAGNOSIS — D485 Neoplasm of uncertain behavior of skin: Secondary | ICD-10-CM | POA: Diagnosis not present

## 2023-01-06 DIAGNOSIS — L570 Actinic keratosis: Secondary | ICD-10-CM | POA: Diagnosis not present

## 2023-01-06 DIAGNOSIS — Z85828 Personal history of other malignant neoplasm of skin: Secondary | ICD-10-CM | POA: Diagnosis not present

## 2023-01-06 DIAGNOSIS — L821 Other seborrheic keratosis: Secondary | ICD-10-CM | POA: Diagnosis not present

## 2023-01-06 DIAGNOSIS — L814 Other melanin hyperpigmentation: Secondary | ICD-10-CM | POA: Diagnosis not present

## 2023-01-06 DIAGNOSIS — I8392 Asymptomatic varicose veins of left lower extremity: Secondary | ICD-10-CM | POA: Diagnosis not present

## 2023-01-18 ENCOUNTER — Telehealth: Payer: Self-pay | Admitting: Pulmonary Disease

## 2023-01-18 MED ORDER — ANORO ELLIPTA 62.5-25 MCG/ACT IN AEPB: 1.0000 | INHALATION_SPRAY | Freq: Every day | RESPIRATORY_TRACT | 4 refills | Status: DC

## 2023-01-18 NOTE — Telephone Encounter (Signed)
Stop the [employer].  She can go back to Anoro that she was taking previously.

## 2023-01-18 NOTE — Telephone Encounter (Signed)
Called and spoke with patient. She verbalized understanding. I also advised her that I would place the [facility] on her allergy list.   While on the phone, she mentioned that she would need to have the Anoro sent to her pharmacy. I advised her I would go ahead and send it in. She verbalized understanding.   Nothing further needed at time of call.

## 2023-01-18 NOTE — Telephone Encounter (Signed)
Called and spoke with patient. She stated that she was switched to [place] on 12/23/22. She stated that she felt well for a few days but then started to notice side effects. She noticed that voice turned raspy and she was clearing her throat a lot during the day. In the evenings, she would lose her voice completely. Denied any throat pain or difficulty swallowing.   She also mentioned that her eyesight in both eyes have become blurry. She has a history of macular degeneration in her right eye and receives injections for this. She has noticed the vision loss the most in the right eye.   She last used the [employer] last night. I advised her to not use the Breztri until she heard back from our office. She verbalized understanding. She has been using her sodium chloride nebulizer solution and had used Anoro before in the past but felt it was losing its effects.   Pharmacy is [facility] [address].   Dr. Isaiah Serge, can you please advise? Thanks!

## 2023-01-18 NOTE — Telephone Encounter (Signed)
Patient states Breztri causing side effects. Patient phone number is [phone].

## 2023-02-10 ENCOUNTER — Other Ambulatory Visit (HOSPITAL_BASED_OUTPATIENT_CLINIC_OR_DEPARTMENT_OTHER): Payer: Self-pay | Admitting: Cardiology

## 2023-02-23 ENCOUNTER — Encounter: Payer: Self-pay | Admitting: Pulmonary Disease

## 2023-02-23 ENCOUNTER — Ambulatory Visit (INDEPENDENT_AMBULATORY_CARE_PROVIDER_SITE_OTHER): Payer: Medicare Other | Admitting: Pulmonary Disease

## 2023-02-23 ENCOUNTER — Ambulatory Visit: Payer: Medicare Other | Admitting: Pulmonary Disease

## 2023-02-23 VITALS — BP 118/60 | HR 75 | Temp 98.2°F | Ht 66.0 in | Wt 122.4 lb

## 2023-02-23 DIAGNOSIS — J439 Emphysema, unspecified: Secondary | ICD-10-CM

## 2023-02-23 DIAGNOSIS — J479 Bronchiectasis, uncomplicated: Secondary | ICD-10-CM

## 2023-02-23 LAB — PULMONARY FUNCTION TEST
DL/VA % pred: 88 %
DL/VA: 3.51 ml/min/mmHg/L
DLCO cor % pred: 83 %
DLCO cor: 16.3 ml/min/mmHg
DLCO unc % pred: 83 %
DLCO unc: 16.3 ml/min/mmHg
FEF 25-75 Post: 2.1 L/sec
FEF 25-75 Pre: 1.82 L/sec
FEF2575-%Change-Post: 14 %
FEF2575-%Pred-Post: 189 %
FEF2575-%Pred-Pre: 165 %
FEV1-%Change-Post: 2 %
FEV1-%Pred-Post: 114 %
FEV1-%Pred-Pre: 111 %
FEV1-Post: 2.11 L
FEV1-Pre: 2.06 L
FEV1FVC-%Change-Post: 0 %
FEV1FVC-%Pred-Pre: 108 %
FEV6-%Change-Post: 1 %
FEV6-%Pred-Post: 114 %
FEV6-%Pred-Pre: 112 %
FEV6-Post: 2.68 L
FEV6-Pre: 2.63 L
FEV6FVC-%Pred-Post: 106 %
FEV6FVC-%Pred-Pre: 106 %
FVC-%Change-Post: 1 %
FVC-%Pred-Post: 107 %
FVC-%Pred-Pre: 105 %
FVC-Post: 2.68 L
FVC-Pre: 2.63 L
Post FEV1/FVC ratio: 79 %
Post FEV6/FVC ratio: 100 %
Pre FEV1/FVC ratio: 78 %
Pre FEV6/FVC Ratio: 100 %
RV % pred: 138 %
RV: 3.66 L
TLC % pred: 119 %
TLC: 6.39 L

## 2023-02-23 NOTE — Progress Notes (Signed)
Nichole Lawrence    [id number]    20-Mar-1933  Primary Care Physician:Ramachandran, Campbell Lerner, MD  Referring Physician: Georgianne Fick, MD [address] [place],  [place] [address]  Chief complaint: Follow-up for emphysema, bronchiectasis, lung nodules.  HPI: 89 y.o.  with history of pulmonary emphysema, bronchiectasis, pulmonary nodules.  Previously followed by Dr. Jamison Neighbor She is a non-smoker with exposure to secondhand smoke.  No evidence of alpha-1 antitrypsin deficiency.  Seen in pulmonary clinic in May 2019 with pneumonia, bronchiectasis exacerbation. CT at that time showed worsening pulmonary opacities.  However follow-up chest x-ray showed improvement after treatment with antibiotics  Pets: Dog, no cats, birds. 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 Exposures: No known exposures, no mold, hot tub, Jacuzzi Smoking history: Never smoker exposed to secondhand smoke  Travel history: Originally from [place]. Relevant family history: Father had COPD.  He was a smoker.  Interim history: Continues on anoro inhaler Note some mild increase in dyspnea as she is working outside in the garden in the pollen season Denies any cough, sputum production, fevers, chills.  She is using the flutter valve but stopped using the hypertonic saline nebulizers Here for review of PFTs  Outpatient Encounter Medications as of 02/23/2023  Medication Sig   albuterol (VENTOLIN HFA) 108 (90 Base) MCG/ACT inhaler Inhale 2 puffs into the lungs every 6 (six) hours as needed for wheezing or shortness of breath.   atorvastatin (LIPITOR) 40 MG tablet Take 1 tablet by mouth once daily   diltiazem (CARDIZEM CD) 180 MG 24 hr capsule Take 1 capsule by mouth once daily   losartan (COZAAR) 25 MG tablet Take 1/2 (one-half) tablet by mouth once daily   MAGNESIUM SULFATE PO Take 250 mg by mouth daily.   Multiple Vitamin (MULTIVITAMIN) capsule Take 1 capsule by mouth daily.    Respiratory Therapy Supplies (FLUTTER) DEVI Three times a day for cough and congestion   Rivaroxaban (XARELTO) 15 MG TABS tablet Take 1 tablet (15 mg total) by mouth daily.   sodium chloride HYPERTONIC 3 % nebulizer solution Take by nebulization as needed for other.   umeclidinium-vilanterol (ANORO ELLIPTA) 62.5-25 MCG/ACT AEPB Inhale 1 puff into the lungs daily.   No facility-administered encounter medications on file as of 02/23/2023.   Physical Exam: Blood pressure 118/60, pulse 75, temperature 98.2 F (36.8 C), temperature source Oral, height 5\' 6"  (1.676 m), weight 122 lb 6.4 oz (55.5 kg), SpO2 96%. Gen:      No acute distress HEENT:  EOMI, sclera anicteric Neck:     No masses; no thyromegaly Lungs:    Clear to auscultation bilaterally; normal respiratory effort CV:         Regular rate and rhythm; no murmurs Abd:      + bowel sounds; soft, non-tender; no palpable masses, no distension Ext:    No edema; adequate peripheral perfusion Skin:      Warm and dry; no rash Neuro: alert and oriented x 3 Psych: normal mood and affect   Data Reviewed: Imaging CT CHEST W/ CONTRAST 07/01/15   nodularity noted in bilateral lungs and multiple lobes upper, middle, lingula, and lower. No pathologic mediastinal adenopathy. Largest nodule measures 1 cm in right upper lobe with some spiculation. No pleural effusion or thickening. No pericardial effusion. very mild apical predominant emphysema. Mild pectus excavatum.   CT CHEST W/O 05/12/17   New 6 mm nodule in left lower lobe. Diffuse  bronchial wall thickening with findings consistent with bronchiectasis and right middle and upper lobes bilaterally. Tree-in-bud opacities predominantly within right upper and right middle lobes. No pleural effusion or thickening. No pericardial effusion. No pathologic mediastinal adenopathy.   CT chest without contrast 12/19/2017- 17 but opacity, patchy consolidation, groundglass attenuation, nodularity, bronchiectasis.   Stable 6 mm left lower lobe nodule.  CT high-resolution 02/21/2019- no interstitial lung disease.  Nodules, tree-in-bud bilaterally.  More than the right upper and middle lobe.  Improved compared to prior.  CT chest 05/20/2021-Spectrum findings with chronic nodularity, tree-in-bud, bronchiectasis Slightly worsened  CT high-resolution 12/13/2022-slight increase in bronchiectasis, peribronchovascular nodularity, tree-in-bud. I have reviewed the images personally.  PFT 06/12/17: FVC 2.13 L (74%) FEV1 1.76 L (82%) FEV1/FVC 0.83 FEF 25-75 1.70 L (118%) positive bronchodilator response TLC 5.67 L (103%) RV 134% ERV 80% DLCO corrected 53% 07/30/15: FVC 2.68 L (90%) FEV1 1.94 L (88%) FEV1/FVC 0.72 FEF 25-75 1.32 L (85%) negative bronchodilator response TLC 6.49 L (117%) RV 149% ERV 117% DLCO uncorrected 54%  02/23/2023 FVC 2.68 [107%], FEV1 2.11 [114%], F/F79, TLC 6.39 [119%], DLCO 16.30 [83%] Normal test  LABS 05/11/17 Alpha-1 antitrypsin: MM (154) ESR: 3 CRP: 0.3 ANA:  1:160, speckled RA:  <14 Anti-CCP:  <16   Assessment:  Pulmonary emphysema Has increase in dyspnea.  Continue Breztri inhaler Not clear why she has severe dyspnea on exertion as PFTs today are normal She plans on following up with her cardiologist to evaluate cardiac reasons  Allergic rhinitis with postnasal drip  Advised over-the-counter steroid nasal spray and antihistamine.   Bronchiectasis CT reviewed with slight progressive increase in bronchiectasis and tree-in-bud Currently is not making any sputum hence not be tested for cultures to rule out MAI Has declined bronchoscopy in the past.  Will try to avoid starting treatment due to age and frailty  I asked her to start reusing the hypertonic saline.  Continue flutter valve, Mucinex Schedule PFTs for reassessment   Health maintenance 07/04/2013- Prevnar 09/27/2016-Pneumovax  She is up-to-date with Covid vaccination  Plan/Recommendations: - Continue Breztri -  Flutter valve, Mucinex - OTC steroid nasal spray, antihistamine  Chilton Greathouse MD [facility] 02/23/2023, 2:12 PM  CC: Georgianne Fick, MD

## 2023-02-23 NOTE — Progress Notes (Signed)
Full PFT performed today. °

## 2023-02-23 NOTE — Patient Instructions (Signed)
Lung function tests are normal which is good news Continue evaluation with cardiology for possible heart reasons for shortness of breath Return to clinic in 3 to 4 months

## 2023-02-23 NOTE — Patient Instructions (Signed)
Full PFT performed today. °

## 2023-03-01 DIAGNOSIS — H353211 Exudative age-related macular degeneration, right eye, with active choroidal neovascularization: Secondary | ICD-10-CM | POA: Diagnosis not present

## 2023-03-01 NOTE — Progress Notes (Signed)
HPI: FU atrial fibrillation and SVT. Nuclear study August 2018 showed normal perfusion and ejection fraction 72%. Pulmonary function tests October 2018 showed moderately decreased DLCO and restrictive disease. Echo August 2023 showed normal LV function, grade 1 diastolic dysfunction, mild mitral regurgitation, mild to moderate aortic insufficiency.  High-resolution CT April 2024 showed probable findings suggestive of MAI infection, coronary calcification, ectasia of the ascending aorta at 4 cm.  Since last seen she notes some increased dyspnea.  She has mild dyspnea with walking 1-1/2 miles.  She notices her dyspnea more in the morning hours.  She wonders whether there may be a contribution from anxiety.  Notes she does not have chest pain.  No orthopnea, PND or pedal edema.  No bleeding from Xarelto.  Current Outpatient Medications  Medication Sig Dispense Refill   umeclidinium-vilanterol (ANORO ELLIPTA) 62.5-25 MCG/ACT AEPB Inhale 1 puff into the lungs daily. 60 each 4   albuterol (VENTOLIN HFA) 108 (90 Base) MCG/ACT inhaler Inhale 2 puffs into the lungs every 6 (six) hours as needed for wheezing or shortness of breath. 8 g 6   atorvastatin (LIPITOR) 40 MG tablet Take 1 tablet by mouth once daily 90 tablet 2   diltiazem (CARDIZEM CD) 180 MG 24 hr capsule Take 1 capsule by mouth once daily 90 capsule 3   losartan (COZAAR) 25 MG tablet Take 1/2 (one-half) tablet by mouth once daily 45 tablet 0   MAGNESIUM SULFATE PO Take 250 mg by mouth daily.     Multiple Vitamin (MULTIVITAMIN) capsule Take 1 capsule by mouth daily.     Respiratory Therapy Supplies (FLUTTER) DEVI Three times a day for cough and congestion 1 each 0   Rivaroxaban (XARELTO) 15 MG TABS tablet Take 1 tablet (15 mg total) by mouth daily. 30 tablet 5   No current facility-administered medications for this visit.     Past Medical History:  Diagnosis Date   Breast cancer (HCC)    SVT (supraventricular tachycardia)     Past  Surgical History:  Procedure Laterality Date   BACK SURGERY     MASTECTOMY Right    TONSILLECTOMY     TUBAL LIGATION      Social History   Socioeconomic History   Marital status: Widowed    Spouse name: Not on file   Number of children: 2   Years of education: Not on file   Highest education level: Not on file  Occupational History   Occupation: Retired Charity fundraiser  Tobacco Use   Smoking status: Never    Passive exposure: Yes   Smokeless tobacco: Never   Tobacco comments:    Husband and Father   Vaping Use   Vaping status: Never Used  Substance and Sexual Activity   Alcohol use: Yes    Alcohol/week: 0.0 standard drinks of alcohol    Comment: Occasional   Drug use: No   Sexual activity: Not on file  Other Topics Concern   Not on file  Social History Narrative   [facility] (05/11/17):   Originally from [place]. [place]. Moved to [place] in 2006. 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 No known TB exposure. No mold, bird, or hot tub exposure. Remotely had a swimming pool. Enjoys exercising and walking. She does have a dog and enjoys gardening.    Social Determinants of Health   Financial Resource Strain: Not on file  Food Insecurity: Not on file  Transportation Needs: Not on  file  Physical Activity: Not on file  Stress: Not on file  Social Connections: Not on file  Intimate Partner Violence: Not on file    Family History  Problem Relation Age of Onset   Heart disease Mother    Emphysema Mother        never smoked   Rheum arthritis Mother    Breast cancer Mother    Lung cancer Father    CAD Brother     ROS: no fevers or chills, productive cough, hemoptysis, dysphasia, odynophagia, melena, hematochezia, dysuria, hematuria, rash, seizure activity, orthopnea, PND, pedal edema, claudication. Remaining systems are negative.  Physical Exam: Well-developed well-nourished in no acute distress.  Skin is warm and dry.  HEENT is normal.  Neck  is supple.  Chest is clear to auscultation with normal expansion.  Cardiovascular exam is regular rate and rhythm.  Abdominal exam nontender or distended. No masses palpated. Extremities show no edema. neuro grossly intact  EKG Interpretation Date/Time:  Thursday March 09 2023 07:44:51 EDT Ventricular Rate:  67 PR Interval:  264 QRS Duration:  90 QT Interval:  402 QTC Calculation: 424 R Axis:   -48  Text Interpretation: Sinus rhythm with 1st degree A-V block Possible Left atrial enlargement Left axis deviation Left ventricular hypertrophy ( R in aVL , Cornell product ) Cannot rule out Septal infarct (cited on or before 02-Mar-2021) When compared with ECG of 02-Mar-2021 14:39, Abberant conduction is no longer Present Confirmed by Olga Millers ([id number]) on 03/09/2023 7:46:28 AM    A/P  1 paroxysmal atrial fibrillation-patient is in sinus rhythm today.  Continue Cardizem and Xarelto at present dose.  Will consider addition of antiarrhythmic in the future if she has more frequent episodes.  Check hemoglobin and renal function.  2 coronary calcification-continue statin.  No aspirin given need for anticoagulation.  3 aortic insufficiency-mild to moderate on most recent echocardiogram.  Will repeat echo 8/24.  4 hypertension-blood pressure controlled.  Continue present medications.  5 hyperlipidemia-continue statin.  Check lipids and liver.  6 dyspnea-this is felt likely secondary to a combination of COPD and bronchiectasis.  She feels as though this is worse recently.  Not clear to me that this is cardiac related as she is able to walk 1-1/2 miles with mild dyspnea.  Her dyspnea is more prominent in the morning.  I will arrange a cardiac CTA to rule out significant obstructive coronary disease.  Olga Millers, MD

## 2023-03-09 ENCOUNTER — Encounter: Payer: Self-pay | Admitting: Cardiology

## 2023-03-09 ENCOUNTER — Ambulatory Visit: Payer: Medicare Other | Attending: Cardiology | Admitting: Cardiology

## 2023-03-09 VITALS — BP 122/60 | HR 64 | Ht 66.0 in | Wt 122.4 lb

## 2023-03-09 DIAGNOSIS — E785 Hyperlipidemia, unspecified: Secondary | ICD-10-CM

## 2023-03-09 DIAGNOSIS — I480 Paroxysmal atrial fibrillation: Secondary | ICD-10-CM | POA: Diagnosis not present

## 2023-03-09 DIAGNOSIS — I2510 Atherosclerotic heart disease of native coronary artery without angina pectoris: Secondary | ICD-10-CM | POA: Diagnosis not present

## 2023-03-09 DIAGNOSIS — R0600 Dyspnea, unspecified: Secondary | ICD-10-CM

## 2023-03-09 DIAGNOSIS — I359 Nonrheumatic aortic valve disorder, unspecified: Secondary | ICD-10-CM | POA: Diagnosis not present

## 2023-03-09 DIAGNOSIS — I471 Supraventricular tachycardia, unspecified: Secondary | ICD-10-CM

## 2023-03-09 MED ORDER — METOPROLOL TARTRATE 100 MG PO TABS
ORAL_TABLET | ORAL | 0 refills | Status: AC
Start: 2023-03-09 — End: ?

## 2023-03-09 NOTE — Patient Instructions (Signed)
Testing/Procedures:  Your physician has requested that you have an echocardiogram. Echocardiography is a painless test that uses sound waves to create images of your heart. It provides your doctor with information about the size and shape of your heart and how well your heart's chambers and valves are working. This procedure takes approximately one hour. There are no restrictions for this procedure. Please do NOT wear cologne, perfume, aftershave, or lotions (deodorant is allowed). Please arrive 15 minutes prior to your appointment time. [address]     Your cardiac CT will be scheduled at   [facility] [address] [place], [place] [address] [phone]  If scheduled at [facility], please arrive at the [facility] and Children's Entrance (Entrance C2) of [facility] 30 minutes prior to test start time. You can use the FREE valet parking offered at entrance C (encouraged to control the heart rate for the test)  Proceed to the [facility] Radiology Department (first floor) to check-in and test prep.  All radiology patients and guests should use entrance C2 at [facility], accessed from [facility], even though the hospital's physical address listed is [address].        Please follow these instructions carefully (unless otherwise directed):  An IV will be required for this test and Nitroglycerin will be given.    On the Night Before the Test: Be sure to Drink plenty of water. Do not consume any caffeinated/decaffeinated beverages or chocolate 12 hours prior to your test. Do not take any antihistamines 12 hours prior to your test.   On the Day of the Test: Drink plenty of water until 1 hour prior to the test. Do not eat any food 1 hour prior to test. You may take your regular medications prior to the test.  Take metoprolol (Lopressor) 100 MG two hours prior to test. FEMALES- please wear  underwire-free bra if available, avoid dresses & tight clothing       After the Test: Drink plenty of water. After receiving IV contrast, you may experience a mild flushed feeling. This is normal. On occasion, you may experience a mild rash up to 24 hours after the test. This is not dangerous. If this occurs, you can take Benadryl 25 mg and increase your fluid intake. If you experience trouble breathing, this can be serious. If it is severe call 911 IMMEDIATELY. If it is mild, please call our office.   We will call to schedule your test 2-4 weeks out understanding that some insurance companies will need an authorization prior to the service being performed.   For more information and frequently asked questions, please visit our website : [email or web address]  For non-scheduling related questions, please contact the cardiac imaging nurse navigator should you have any questions/concerns: Cardiac Imaging Nurse Navigators Direct Office Dial: [phone]   For scheduling needs, including cancellations and rescheduling, please call [place], [phone].    Follow-Up: At [facility], you and your health needs are our priority.  As part of our continuing mission to provide you with exceptional heart care, we have created designated Provider Care Teams.  These Care Teams include your primary Cardiologist (physician) and Advanced Practice Providers (APPs -  Physician Assistants and Nurse Practitioners) who all work together to provide you with the care you need, when you need it.  We recommend signing up for the patient portal called "MyChart".  Sign up information is provided on this After  Visit Summary.  MyChart is used to connect with patients for Virtual Visits (Telemedicine).  Patients are able to view lab/test results, encounter notes, upcoming appointments, etc.  Non-urgent messages can be sent to your provider as well.   To learn more about what you can do with  MyChart, go to [email or web address].    Your next appointment:   3 month(s)  Provider:   Olga Millers, MD

## 2023-03-10 LAB — LIPID PANEL
HDL: 69 mg/dL (ref >39)
Triglycerides: 61 mg/dL (ref 0–149)

## 2023-03-10 LAB — COMPREHENSIVE METABOLIC PANEL
Glucose: 99 mg/dL (ref 70–99)
Sodium: 138 mmol/L (ref 134–144)

## 2023-03-13 ENCOUNTER — Telehealth (HOSPITAL_COMMUNITY): Payer: Self-pay | Admitting: *Deleted

## 2023-03-13 ENCOUNTER — Telehealth: Payer: Self-pay | Admitting: *Deleted

## 2023-03-13 DIAGNOSIS — R748 Abnormal levels of other serum enzymes: Secondary | ICD-10-CM

## 2023-03-13 DIAGNOSIS — I5020 Unspecified systolic (congestive) heart failure: Secondary | ICD-10-CM

## 2023-03-13 NOTE — Telephone Encounter (Signed)
-----   Message from Olga Millers sent at 03/10/2023  7:15 AM EDT ----- Low K diet; in 2 weeks check BMET, GGT and 5' nucleotidase Olga Millers

## 2023-03-13 NOTE — Telephone Encounter (Signed)
Pt has reviewed results via my chart  Lab orders mailed to the pt  

## 2023-03-13 NOTE — Telephone Encounter (Signed)
Reaching out to patient to offer assistance regarding upcoming cardiac imaging study; pt verbalizes understanding of appt date/time, parking situation and where to check in, pre-test NPO status and medications ordered, and verified current allergies; name and call back number provided for further questions should they arise ? ?Merle Prescott RN Navigator Cardiac Imaging ?[facility] ?[phone] office ?[phone] cell ? ?Patient to take 50mg metoprolol tartrate two hours prior to her cardiac CT scan. She is aware to arrive at 9:30am.  ?

## 2023-03-14 ENCOUNTER — Ambulatory Visit (HOSPITAL_COMMUNITY)
Admission: RE | Admit: 2023-03-14 | Discharge: 2023-03-14 | Disposition: A | Discharge status: Home / Self Care | Payer: Medicare Other | Source: Ambulatory Visit | Attending: Cardiology | Admitting: Cardiology

## 2023-03-14 DIAGNOSIS — R0600 Dyspnea, unspecified: Secondary | ICD-10-CM | POA: Insufficient documentation

## 2023-03-14 DIAGNOSIS — Q676 Pectus excavatum: Secondary | ICD-10-CM | POA: Insufficient documentation

## 2023-03-14 DIAGNOSIS — I700 Atherosclerosis of aorta: Secondary | ICD-10-CM | POA: Diagnosis not present

## 2023-03-14 MED ORDER — NITROGLYCERIN 0.4 MG SL SUBL
0.8000 mg | SUBLINGUAL_TABLET | SUBLINGUAL | Status: DC | PRN
  Administered 2023-03-14: 0.8 mg via SUBLINGUAL

## 2023-03-14 MED ORDER — IOHEXOL 350 MG/ML SOLN
95.0000 mL | Freq: Once | INTRAVENOUS | Status: AC | PRN
  Administered 2023-03-14: 95 mL via INTRAVENOUS

## 2023-03-14 MED ORDER — NITROGLYCERIN 0.4 MG SL SUBL
SUBLINGUAL_TABLET | SUBLINGUAL | Status: AC
  Filled 2023-03-14: qty 2

## 2023-03-14 NOTE — Progress Notes (Signed)
Pt verbalized understanding of discharge instruction; opportunity for questions provided ?

## 2023-03-21 ENCOUNTER — Ambulatory Visit (HOSPITAL_COMMUNITY): Payer: Medicare Other | Attending: Cardiovascular Disease

## 2023-03-21 DIAGNOSIS — I359 Nonrheumatic aortic valve disorder, unspecified: Secondary | ICD-10-CM | POA: Insufficient documentation

## 2023-03-21 DIAGNOSIS — R0600 Dyspnea, unspecified: Secondary | ICD-10-CM | POA: Diagnosis not present

## 2023-03-21 LAB — ECHOCARDIOGRAM COMPLETE
Area-P 1/2: 3.37 cm2
Est EF: 55
P 1/2 time: 399 msec
S' Lateral: 2.5 cm

## 2023-03-29 DIAGNOSIS — R748 Abnormal levels of other serum enzymes: Secondary | ICD-10-CM | POA: Diagnosis not present

## 2023-03-29 DIAGNOSIS — I5020 Unspecified systolic (congestive) heart failure: Secondary | ICD-10-CM | POA: Diagnosis not present

## 2023-04-01 LAB — BASIC METABOLIC PANEL: Calcium: 10.2 mg/dL (ref 8.7–10.3)

## 2023-04-03 ENCOUNTER — Telehealth: Payer: Self-pay | Admitting: *Deleted

## 2023-04-03 DIAGNOSIS — R748 Abnormal levels of other serum enzymes: Secondary | ICD-10-CM

## 2023-04-03 NOTE — Telephone Encounter (Signed)
pt aware of results  Order placed

## 2023-04-03 NOTE — Telephone Encounter (Signed)
-----   Message from Olga Millers sent at 04/01/2023 11:01 AM EDT ----- Schedule liver ultrasound Olga Millers

## 2023-04-19 ENCOUNTER — Ambulatory Visit: Admission: RE | Admit: 2023-04-19 | Payer: Medicare Other | Source: Ambulatory Visit

## 2023-04-19 DIAGNOSIS — R7989 Other specified abnormal findings of blood chemistry: Secondary | ICD-10-CM | POA: Diagnosis not present

## 2023-04-19 DIAGNOSIS — R748 Abnormal levels of other serum enzymes: Secondary | ICD-10-CM

## 2023-04-19 DIAGNOSIS — K760 Fatty (change of) liver, not elsewhere classified: Secondary | ICD-10-CM | POA: Diagnosis not present

## 2023-04-20 ENCOUNTER — Encounter: Payer: Self-pay | Admitting: Pulmonary Disease

## 2023-04-20 MED ORDER — ALBUTEROL SULFATE HFA 108 (90 BASE) MCG/ACT IN AERS: 2.0000 | INHALATION_SPRAY | Freq: Four times a day (QID) | RESPIRATORY_TRACT | 6 refills | Status: AC | PRN

## 2023-04-25 IMAGING — MR MR MRA HEAD W/O CM
4 series · 15 of 48 positions shown · non-contrast
Comparison: CT head from the same day.

CLINICAL DATA: Stroke follow-up.



[Series 4: (id) mt fs · axial · 1.4mm · 0.43mm/px · z∈[-51,+38]mm · 12 of 136 slices shown]
[im 1/136]
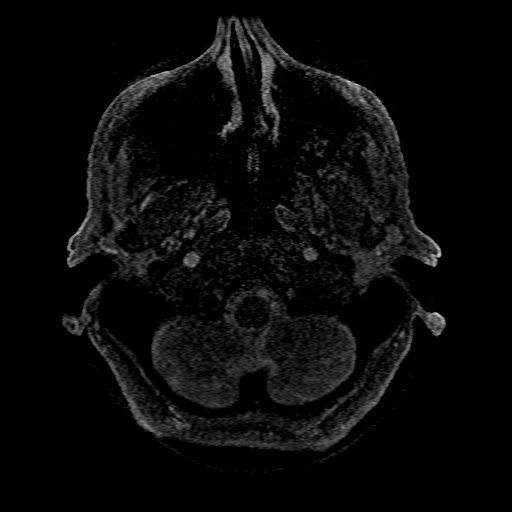
[im 7/136]
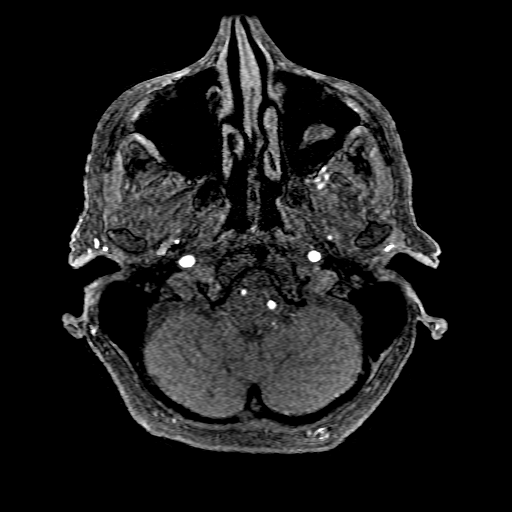
[im 22/136]
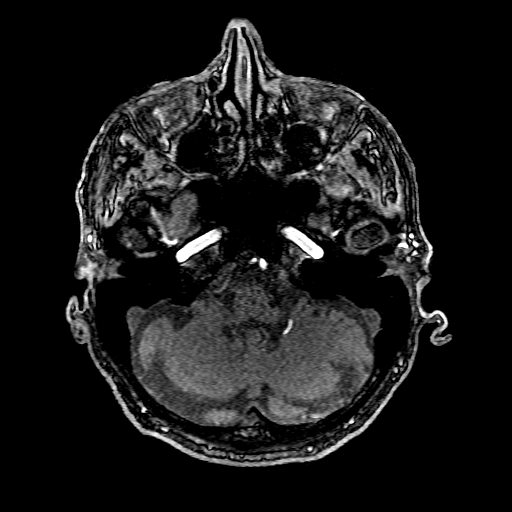
[im 25/136]
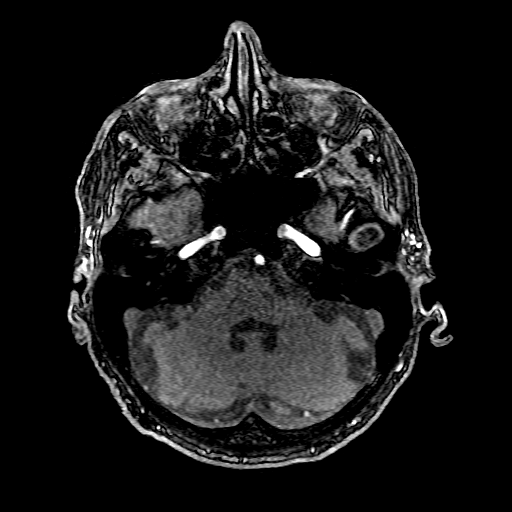
[im 43/136]
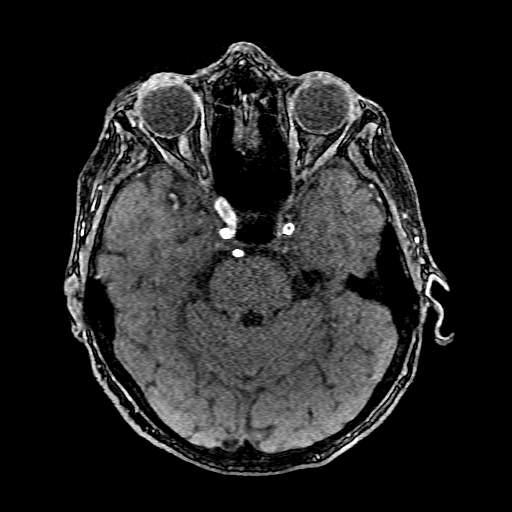
[im 59/136]
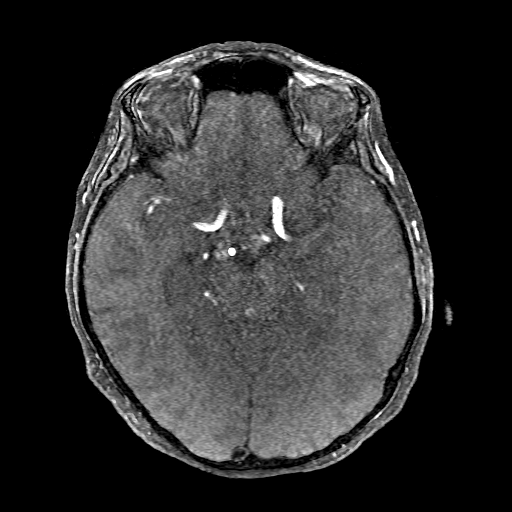
[im 68/136]
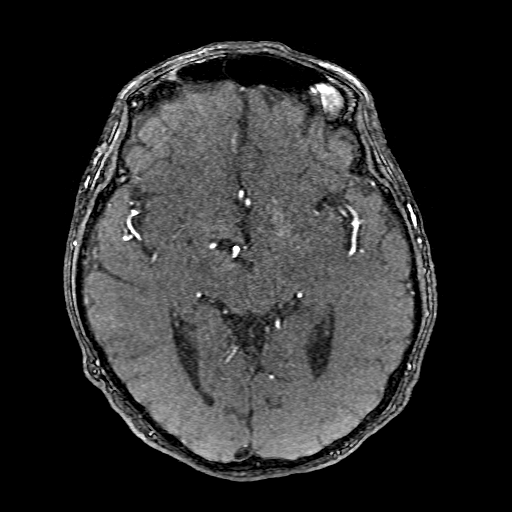
[im 77/136]
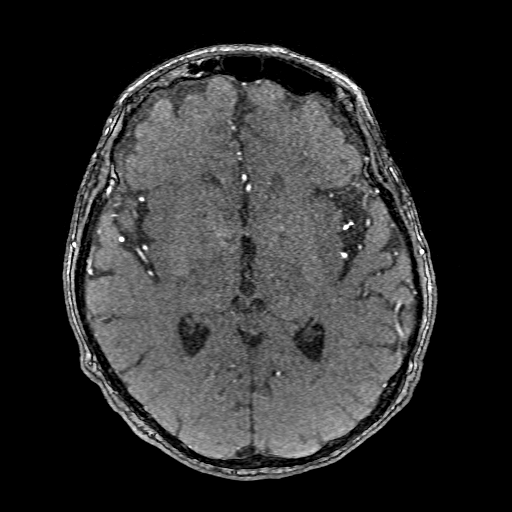
[im 93/136]
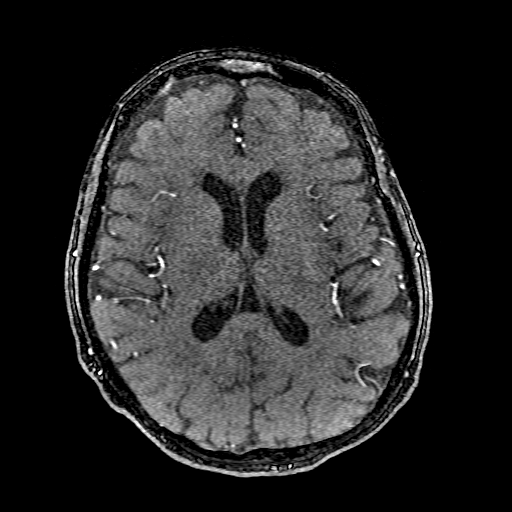
[im 111/136]
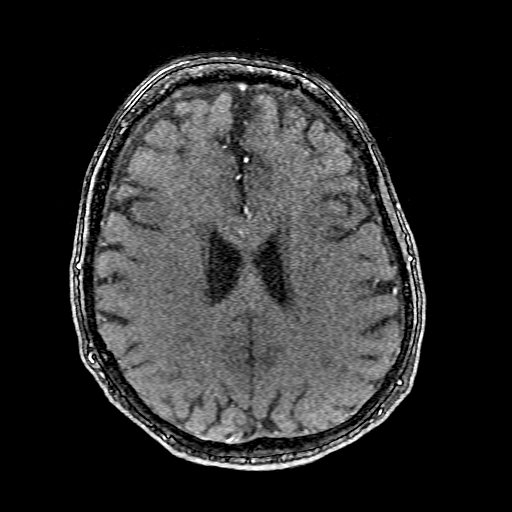
[im 114/136]
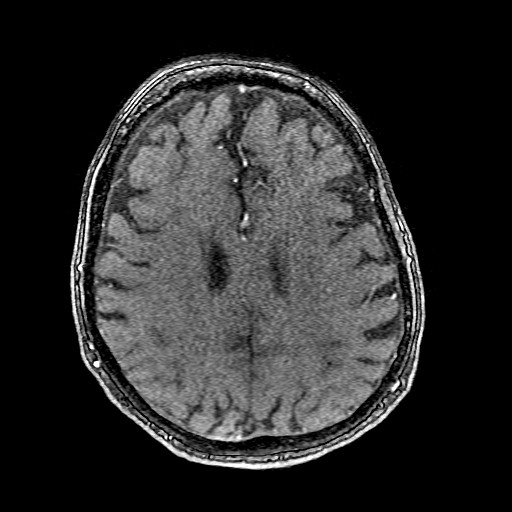
[im 129/136]
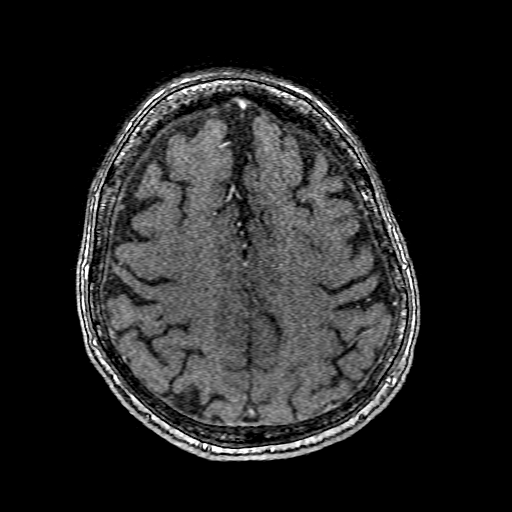

[Series 401: processed images · axial · 1.4mm · 0.43mm/px · 1 of 1 slices shown (1 of 3)]
[im 1/1]
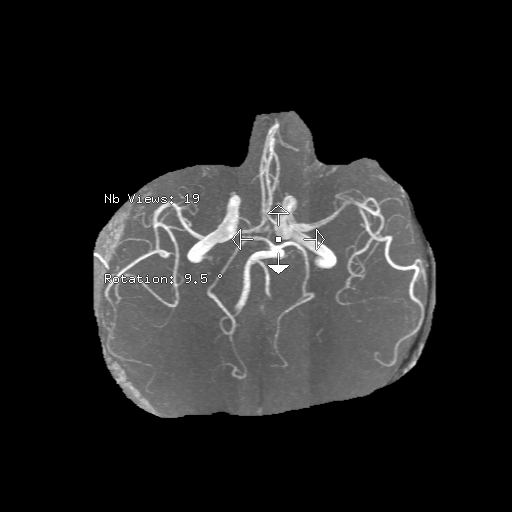

[Series 403: processed images · axial · 1.4mm · 0.21mm/px · 1 of 1 slices shown (2 of 3)]
[im 1/1]
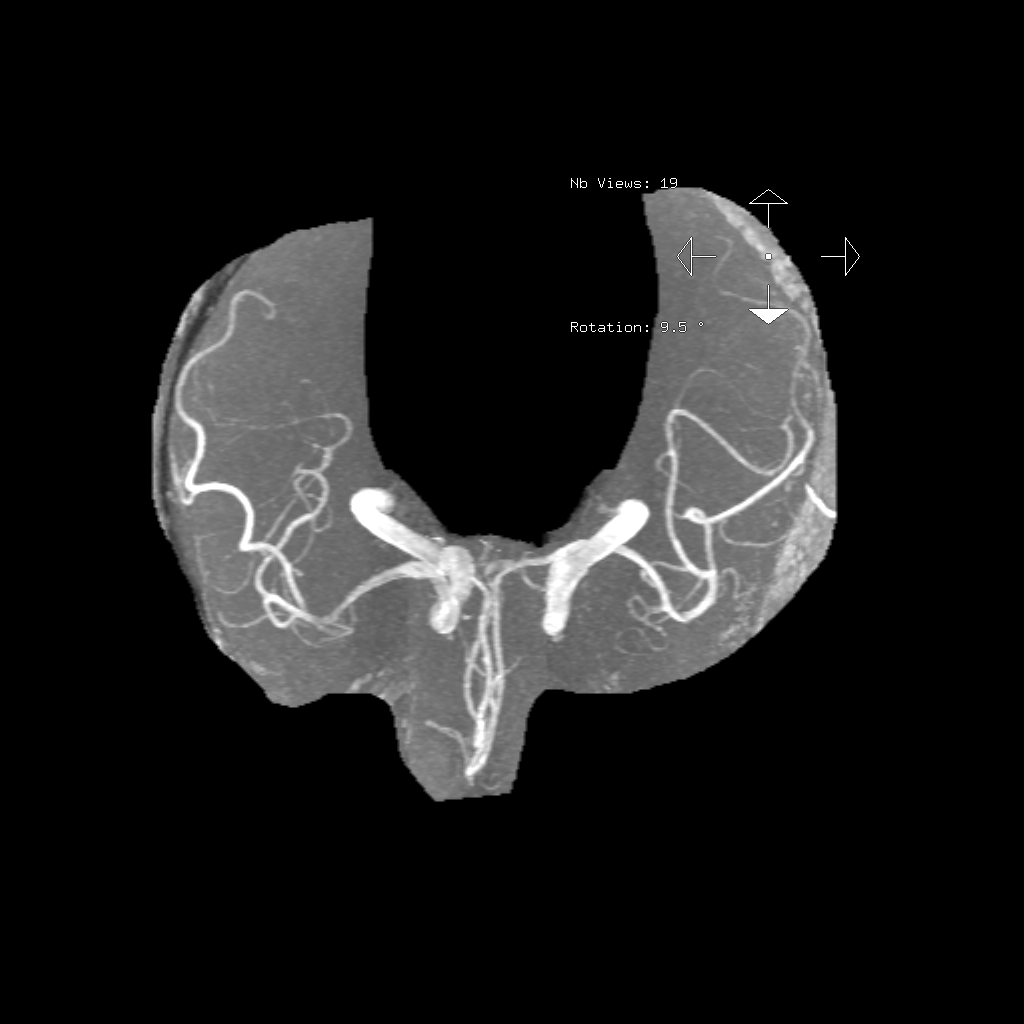

[Series 405: processed images · axial · 1.4mm · 0.43mm/px · 1 of 1 slices shown (3 of 3)]
[im 1/1]
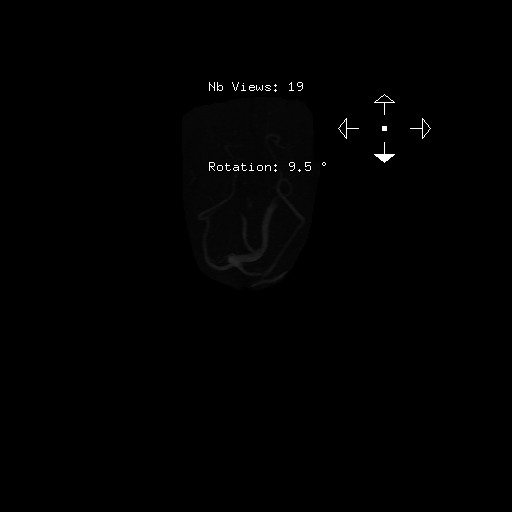

[15 of 48 positions shown; findings below may reference images not displayed]

FINDINGS: MRI HEAD FINDINGS

Brain: Punctate acute infarct in the left posterior parietal lobe
(series 3, image 28; series 7, image 11). Suspected additional
punctate infarct in the left occipital lobe (series 3, image 26). No
edema or mass effect. Moderate scattered T2/FLAIR hyperintensities
within the white matter, nonspecific but most likely related to
chronic microvascular ischemic disease. Remote infarcts and
bilateral cerebellar hemispheres. Generalized atrophy with ex vacuo
ventricular dilation. No hydrocephalus. No mass lesion or midline
shift. No extra-axial fluid collections.

Vascular: See below.

Skull and upper cervical spine: Normal marrow signal.

Sinuses/Orbits: Secretions within the left maxillary sinus.
Remaining sinuses are clear. No acute orbital findings.

Other: No sizable mastoid effusions.

MRA HEAD FINDINGS

Anterior circulation: Bilateral intracranial ICAs, MCAs, and ACAs
are patent without proximal flow limiting stenosis. Mild right M1
MCA narrowing. No aneurysm identified.

Posterior circulation: Bilateral visualized intradural vertebral
arteries, basilar artery, and posterior cerebral arteries are patent
without proximal flow limiting stenosis. No aneurysm identified.

MRA NECK FINDINGS

Aorta: Left common carotid artery origin and right brachiocephalic
origin are incompletely imaged. Otherwise, image great vessel
origins are patent.

Carotid system: No significant (greater than 50%) stenosis. Left
common carotid artery origin is incompletely imaged.

Vertebral arteries: Left dominant. No evidence significant (greater
than 50%) stenosis. Limited evaluation of the V3 vertebral arteries
due to in plane flow on this noncontrast MRA.
IMPRESSION: MRI head:

1. Punctate acute infarct in the posterior left parietal cortex with
suspected additional punctate acute infarct in the left occipital
lobe. These could be embolic in etiology.
2. Remote bilateral cerebellar lacunar infarcts and moderate chronic
microvascular ischemic disease.

MRA head:

No large vessel occlusion or proximal hemodynamically significant
stenosis.

MRA Neck:

No visible significant (greater than 50%) stenosis.

## 2023-04-26 DIAGNOSIS — H353211 Exudative age-related macular degeneration, right eye, with active choroidal neovascularization: Secondary | ICD-10-CM | POA: Diagnosis not present

## 2023-04-27 ENCOUNTER — Other Ambulatory Visit (HOSPITAL_BASED_OUTPATIENT_CLINIC_OR_DEPARTMENT_OTHER): Payer: Self-pay | Admitting: Cardiology

## 2023-04-27 DIAGNOSIS — I480 Paroxysmal atrial fibrillation: Secondary | ICD-10-CM

## 2023-04-27 DIAGNOSIS — E785 Hyperlipidemia, unspecified: Secondary | ICD-10-CM

## 2023-05-02 DIAGNOSIS — D6869 Other thrombophilia: Secondary | ICD-10-CM | POA: Diagnosis not present

## 2023-05-02 DIAGNOSIS — D692 Other nonthrombocytopenic purpura: Secondary | ICD-10-CM | POA: Diagnosis not present

## 2023-05-02 DIAGNOSIS — J432 Centrilobular emphysema: Secondary | ICD-10-CM | POA: Diagnosis not present

## 2023-05-02 DIAGNOSIS — E782 Mixed hyperlipidemia: Secondary | ICD-10-CM | POA: Diagnosis not present

## 2023-05-02 DIAGNOSIS — Z0000 Encounter for general adult medical examination without abnormal findings: Secondary | ICD-10-CM | POA: Diagnosis not present

## 2023-05-02 DIAGNOSIS — C4362 Malignant melanoma of left upper limb, including shoulder: Secondary | ICD-10-CM | POA: Diagnosis not present

## 2023-05-09 DIAGNOSIS — C4362 Malignant melanoma of left upper limb, including shoulder: Secondary | ICD-10-CM | POA: Diagnosis not present

## 2023-05-09 DIAGNOSIS — M810 Age-related osteoporosis without current pathological fracture: Secondary | ICD-10-CM | POA: Diagnosis not present

## 2023-05-09 DIAGNOSIS — D6869 Other thrombophilia: Secondary | ICD-10-CM | POA: Diagnosis not present

## 2023-05-09 DIAGNOSIS — I700 Atherosclerosis of aorta: Secondary | ICD-10-CM | POA: Diagnosis not present

## 2023-05-09 DIAGNOSIS — E782 Mixed hyperlipidemia: Secondary | ICD-10-CM | POA: Diagnosis not present

## 2023-05-09 DIAGNOSIS — J432 Centrilobular emphysema: Secondary | ICD-10-CM | POA: Diagnosis not present

## 2023-05-09 DIAGNOSIS — N182 Chronic kidney disease, stage 2 (mild): Secondary | ICD-10-CM | POA: Diagnosis not present

## 2023-05-09 DIAGNOSIS — R748 Abnormal levels of other serum enzymes: Secondary | ICD-10-CM | POA: Diagnosis not present

## 2023-05-09 DIAGNOSIS — I480 Paroxysmal atrial fibrillation: Secondary | ICD-10-CM | POA: Diagnosis not present

## 2023-05-09 DIAGNOSIS — M150 Primary generalized (osteo)arthritis: Secondary | ICD-10-CM | POA: Diagnosis not present

## 2023-05-09 DIAGNOSIS — Z23 Encounter for immunization: Secondary | ICD-10-CM | POA: Diagnosis not present

## 2023-05-09 DIAGNOSIS — Z0000 Encounter for general adult medical examination without abnormal findings: Secondary | ICD-10-CM | POA: Diagnosis not present

## 2023-05-09 DIAGNOSIS — D692 Other nonthrombocytopenic purpura: Secondary | ICD-10-CM | POA: Diagnosis not present

## 2023-05-18 ENCOUNTER — Other Ambulatory Visit (HOSPITAL_BASED_OUTPATIENT_CLINIC_OR_DEPARTMENT_OTHER): Payer: Self-pay | Admitting: Cardiology

## 2023-06-01 NOTE — Progress Notes (Signed)
HPI: FU atrial fibrillation and SVT. Pulmonary function tests October 2018 showed moderately decreased DLCO and restrictive disease. High-resolution CT April 2024 showed probable findings suggestive of MAI infection, coronary calcification, ectasia of the ascending aorta at 4 cm.  Coronary CTA July 2024 showed calcium score 78 which was 34th percentile and minimal plaque in the LAD.  Echocardiogram August 2024 showed normal LV function, grade 1 diastolic dysfunction, mild to moderate aortic insufficiency.  Since last seen she has some dyspnea on exertion which is chronic.  No orthopnea, PND, pedal edema, chest pain or syncope.  Current Outpatient Medications  Medication Sig Dispense Refill   albuterol (VENTOLIN HFA) 108 (90 Base) MCG/ACT inhaler Inhale 2 puffs into the lungs every 6 (six) hours as needed for wheezing or shortness of breath. 8 g 6   atorvastatin (LIPITOR) 40 MG tablet Take 1 tablet by mouth once daily 90 tablet 0   diltiazem (CARDIZEM CD) 180 MG 24 hr capsule Take 1 capsule by mouth once daily 90 capsule 3   losartan (COZAAR) 25 MG tablet Take 1/2 (one-half) tablet by mouth once daily 45 tablet 3   MAGNESIUM SULFATE PO Take 250 mg by mouth daily.     Multiple Vitamin (MULTIVITAMIN) capsule Take 1 capsule by mouth daily.     Respiratory Therapy Supplies (FLUTTER) DEVI Three times a day for cough and congestion 1 each 0   Rivaroxaban (XARELTO) 15 MG TABS tablet Take 1 tablet (15 mg total) by mouth daily. 30 tablet 5   umeclidinium-vilanterol (ANORO ELLIPTA) 62.5-25 MCG/ACT AEPB Inhale 1 puff into the lungs daily. 60 each 4   metoprolol tartrate (LOPRESSOR) 100 MG tablet TAKE 2 HOURS PRIOR TO CT SCAN 1 tablet 0   No current facility-administered medications for this visit.     Past Medical History:  Diagnosis Date   Breast cancer (HCC)    SVT (supraventricular tachycardia) (HCC)     Past Surgical History:  Procedure Laterality Date   BACK SURGERY     MASTECTOMY Right     TONSILLECTOMY     TUBAL LIGATION      Social History   Socioeconomic History   Marital status: Widowed    Spouse name: Not on file   Number of children: 2   Years of education: Not on file   Highest education level: Not on file  Occupational History   Occupation: Retired Charity fundraiser  Tobacco Use   Smoking status: Never    Passive exposure: Yes   Smokeless tobacco: Never   Tobacco comments:    Husband and Father   Vaping Use   Vaping status: Never Used  Substance and Sexual Activity   Alcohol use: Yes    Alcohol/week: 0.0 standard drinks of alcohol    Comment: Occasional   Drug use: No   Sexual activity: Not on file  Other Topics Concern   Not on file  Social History Narrative   [facility] [employer] (05/11/17):   Originally from [place]. [place]. Moved to [place] in 2006. 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 No known TB exposure. No mold, bird, or hot tub exposure. Remotely had a swimming pool. Enjoys exercising and walking. She does have a dog and enjoys gardening.    Social Determinants of Health   Financial Resource Strain: Not on file  Food Insecurity: Not on file  Transportation Needs: Not on file  Physical Activity: Not on file  Stress: Not on file  Social Connections: Not on file  Intimate Partner Violence: Not on file    Family History  Problem Relation Age of Onset   Heart disease Mother    Emphysema Mother        never smoked   Rheum arthritis Mother    Breast cancer Mother    Lung cancer Father    CAD Brother     ROS: no fevers or chills, productive cough, hemoptysis, dysphasia, odynophagia, melena, hematochezia, dysuria, hematuria, rash, seizure activity, orthopnea, PND, pedal edema, claudication. Remaining systems are negative.  Physical Exam: Well-developed well-nourished in no acute distress.  Skin is warm and dry.  HEENT is normal.  Neck is supple.  Chest is clear to auscultation with normal expansion.  Cardiovascular  exam is regular rate and rhythm.  Abdominal exam nontender or distended. No masses palpated. Extremities show no edema. neuro grossly intact   A/P  1 paroxysmal atrial fibrillation-patient remains in sinus rhythm on examination.  Will continue Cardizem and Xarelto.  Can consider addition of antiarrhythmic in the future if she has more frequent episodes.  2 dyspnea-this is felt secondary to COPD/bronchiectasis.  Recent coronary CTA showed minimal plaque and echocardiogram showed preserved LV function.  3 aortic insufficiency-mild to moderate on most recent echocardiogram.  4 hypertension-blood pressure controlled.  Continue present medical regimen.  5 coronary calcification-continue statin.  6 hyperlipidemia-continue statin.  Note recent alkaline phosphatase mildly elevated as well as GGT.  Liver ultrasound showed no masses.  Will decrease Lipitor to 20 mg daily.  In 12 weeks we will check lipids, liver, GGT and 5 prime nucleotidase.  Olga Millers, MD

## 2023-06-15 ENCOUNTER — Encounter: Payer: Self-pay | Admitting: Cardiology

## 2023-06-15 ENCOUNTER — Ambulatory Visit: Payer: Medicare Other | Attending: Cardiology | Admitting: Cardiology

## 2023-06-15 VITALS — BP 128/64 | HR 73 | Ht 66.0 in | Wt 121.0 lb

## 2023-06-15 DIAGNOSIS — R748 Abnormal levels of other serum enzymes: Secondary | ICD-10-CM | POA: Diagnosis not present

## 2023-06-15 DIAGNOSIS — I359 Nonrheumatic aortic valve disorder, unspecified: Secondary | ICD-10-CM

## 2023-06-15 DIAGNOSIS — I480 Paroxysmal atrial fibrillation: Secondary | ICD-10-CM | POA: Diagnosis not present

## 2023-06-15 DIAGNOSIS — I2510 Atherosclerotic heart disease of native coronary artery without angina pectoris: Secondary | ICD-10-CM

## 2023-06-15 DIAGNOSIS — E785 Hyperlipidemia, unspecified: Secondary | ICD-10-CM

## 2023-06-15 MED ORDER — ATORVASTATIN CALCIUM 20 MG PO TABS: 20.0000 mg | ORAL_TABLET | Freq: Every day | ORAL | 3 refills | Status: DC

## 2023-06-15 NOTE — Patient Instructions (Signed)
Medication Instructions:  Decrease Lipitor dose to 20 mg daily. New script sent. *If you need a refill on your cardiac medications before your next appointment, please call your pharmacy*   Lab Work: FASTING Lipid and Liver panel, GGT, 5 prime nucleotidase to be done in 12 weeks. If you have labs (blood work) drawn today and your tests are completely normal, you will receive your results only by: MyChart Message (if you have MyChart) OR A paper copy in the mail If you have any lab test that is abnormal or we need to change your treatment, we will call you to review the results.    Follow-Up: At [facility], you and your health needs are our priority.  As part of our continuing mission to provide you with exceptional heart care, we have created designated Provider Care Teams.  These Care Teams include your primary Cardiologist (physician) and Advanced Practice Providers (APPs -  Physician Assistants and Nurse Practitioners) who all work together to provide you with the care you need, when you need it.  We recommend signing up for the patient portal called "MyChart".  Sign up information is provided on this After Visit Summary.  MyChart is used to connect with patients for Virtual Visits (Telemedicine).  Patients are able to view lab/test results, encounter notes, upcoming appointments, etc.  Non-urgent messages can be sent to your provider as well.   To learn more about what you can do with MyChart, go to [email or web address].    Your next appointment:   12 month(s)  Provider:   Olga Millers, MD

## 2023-06-16 ENCOUNTER — Emergency Department (HOSPITAL_BASED_OUTPATIENT_CLINIC_OR_DEPARTMENT_OTHER)
Admission: EM | Admit: 2023-06-16 | Discharge: 2023-06-16 | Disposition: A | Discharge status: Home / Self Care | Payer: Medicare Other | Attending: Emergency Medicine | Admitting: Emergency Medicine

## 2023-06-16 ENCOUNTER — Other Ambulatory Visit: Payer: Self-pay

## 2023-06-16 ENCOUNTER — Emergency Department (HOSPITAL_BASED_OUTPATIENT_CLINIC_OR_DEPARTMENT_OTHER): Payer: Medicare Other

## 2023-06-16 ENCOUNTER — Encounter (HOSPITAL_BASED_OUTPATIENT_CLINIC_OR_DEPARTMENT_OTHER): Payer: Self-pay

## 2023-06-16 DIAGNOSIS — S52532A Colles' fracture of left radius, initial encounter for closed fracture: Secondary | ICD-10-CM | POA: Insufficient documentation

## 2023-06-16 DIAGNOSIS — W010XXA Fall on same level from slipping, tripping and stumbling without subsequent striking against object, initial encounter: Secondary | ICD-10-CM | POA: Insufficient documentation

## 2023-06-16 DIAGNOSIS — S52612A Displaced fracture of left ulna styloid process, initial encounter for closed fracture: Secondary | ICD-10-CM | POA: Diagnosis not present

## 2023-06-16 DIAGNOSIS — Y92015 Private garage of single-family (private) house as the place of occurrence of the external cause: Secondary | ICD-10-CM | POA: Insufficient documentation

## 2023-06-16 DIAGNOSIS — S59202A Unspecified physeal fracture of lower end of radius, left arm, initial encounter for closed fracture: Secondary | ICD-10-CM | POA: Diagnosis not present

## 2023-06-16 DIAGNOSIS — M25532 Pain in left wrist: Secondary | ICD-10-CM | POA: Diagnosis present

## 2023-06-16 MED ORDER — MORPHINE SULFATE (PF) 4 MG/ML IV SOLN
4.0000 mg | Freq: Once | INTRAVENOUS | Status: AC
  Administered 2023-06-16: 4 mg via INTRAVENOUS
  Filled 2023-06-16: qty 1

## 2023-06-16 MED ORDER — TRAMADOL HCL 50 MG PO TABS: 50.0000 mg | ORAL_TABLET | Freq: Three times a day (TID) | ORAL | 0 refills | Status: DC | PRN

## 2023-06-16 NOTE — ED Provider Notes (Signed)
[place] EMERGENCY DEPARTMENT AT [facility] Provider Note   CSN: [id number] Arrival date & time: 06/16/23  1629     History  Chief Complaint  Patient presents with   Nichole Lawrence is a 89 y.o. female.  Patient to ED after mechanical fall onto outstretched left hand to cement surface. She reports pain in the left wrist only. Denies head injury, neck pain, chest/abdominal pain. She has been ambulatory and denies pelvic or hip pain. No wound or bleeding.   The history is provided by the patient and a relative. No language interpreter was used.  Fall       Home Medications Prior to Admission medications   Medication Sig Start Date End Date Taking? Authorizing Provider  traMADol (ULTRAM) 50 MG tablet Take 1 tablet (50 mg total) by mouth every 8 (eight) hours as needed. 06/16/23  Yes Donnetta Gillin, Melvenia Beam, PA-C  albuterol (VENTOLIN HFA) 108 (90 Base) MCG/ACT inhaler Inhale 2 puffs into the lungs every 6 (six) hours as needed for wheezing or shortness of breath. 04/20/23   Mannam, Colbert Coyer, MD  atorvastatin (LIPITOR) 20 MG tablet Take 1 tablet (20 mg total) by mouth daily. 06/15/23   Lewayne Bunting, MD  diltiazem (CARDIZEM CD) 180 MG 24 hr capsule Take 1 capsule by mouth once daily 08/10/22   Lewayne Bunting, MD  losartan (COZAAR) 25 MG tablet Take 1/2 (one-half) tablet by mouth once daily 05/19/23   Lewayne Bunting, MD  MAGNESIUM SULFATE PO Take 250 mg by mouth daily.    [provider]  metoprolol tartrate (LOPRESSOR) 100 MG tablet TAKE 2 HOURS PRIOR TO CT SCAN 03/09/23   Lewayne Bunting, MD  Multiple Vitamin (MULTIVITAMIN) capsule Take 1 capsule by mouth daily.    [provider]  Respiratory Therapy Supplies (FLUTTER) DEVI Three times a day for cough and congestion 12/20/17   Parrett, Virgel Bouquet, NP  Rivaroxaban (XARELTO) 15 MG TABS tablet Take 1 tablet (15 mg total) by mouth daily. 12/09/22   Lewayne Bunting, MD  umeclidinium-vilanterol (ANORO  ELLIPTA) 62.5-25 MCG/ACT AEPB Inhale 1 puff into the lungs daily. 01/18/23   Chilton Greathouse, MD      Allergies    Breztri aerosphere [budeson-glycopyrrol-formoterol], Demerol [meperidine], and Nsaids    Review of Systems   Review of Systems  Physical Exam Updated Vital Signs BP 139/61   Pulse 68   Temp 98.1 F (36.7 C) (Oral)   Resp 16   Ht 5\' 6"  (1.676 m)   Wt 54.9 kg   SpO2 94%   BMI 19.53 kg/m  Physical Exam Vitals and nursing note reviewed.  Constitutional:      Appearance: Normal appearance.     Comments: Awake, alert, younger looking than stated age, provides reliable history.  HENT:     Head: Normocephalic.  Eyes:     Pupils: Pupils are equal, round, and reactive to light.  Neck:     Comments: No midline cervical tenderness.  Cardiovascular:     Rate and Rhythm: Normal rate and regular rhythm.     Heart sounds: No murmur heard. Pulmonary:     Effort: Pulmonary effort is normal.     Breath sounds: No wheezing, rhonchi or rales.  Abdominal:     Palpations: Abdomen is soft.     Tenderness: There is no abdominal tenderness.  Musculoskeletal:     Cervical back: Normal range of motion and neck supple.     Comments: Left wrist  has a dorsal deformity, closed injury. Neurovascularly intact. No other joint or extremity tenderness.   Neurological:     Mental Status: She is oriented to person, place, and time.     ED Results / Procedures / Treatments   Labs (all labs ordered are listed, but only abnormal results are displayed) Labs Reviewed - No data to display  EKG None  Radiology DG Wrist Complete Left  Result Date: 06/16/2023 CLINICAL DATA:  Status post fall with wrist deformity and pain. EXAM: LEFT WRIST - COMPLETE 3+ VIEW COMPARISON:  None Available. FINDINGS: Mildly displaced distal radial metaphyseal fracture. Apex volar angulation. Fracture likely extends to the radiocarpal joint. There is a minimally displaced ulna styloid fracture. The carpal bones are  intact. Soft tissue edema seen at the fracture site. IMPRESSION: 1. Mildly displaced and angulated distal radial metaphyseal fracture. Intra-articular involvement of the radiocarpal joint. 2. Minimally displaced ulna styloid fracture. Electronically Signed   By: Narda Rutherford M.D.   On: 06/16/2023 18:45    Procedures Procedures    Medications Ordered in ED Medications  morphine (PF) 4 MG/ML injection 4 mg (4 mg Intravenous Given 06/16/23 1703)    ED Course/ Medical Decision Making/ A&P Clinical Course as of 06/16/23 1850  Fri Jun 16, 2023  7260 89 year old female here with slip and fall injury to left wrist.  X-rays look like a probable impacted fracture of her distal radius.  She is otherwise neurovascularly intact.  Anticipate will need splint and outpatient orthopedic /hand follow-up [MB]    Clinical Course User Index [MB] Terrilee Files, MD                                 Medical Decision Making This patient presents to the ED for concern of fall on blood thinners, this involves an extensive number of treatment options, and is a complaint that carries with it a high risk of complications and morbidity.  The differential diagnosis includes fracture injury, internal bleeding after injury   Co morbidities that complicate the patient evaluation  CVA, anticoagulated, Breast CA   Additional history obtained:  Additional history obtained from daughter    Imaging Studies ordered:  I ordered imaging studies including left wrist  I independently visualized and interpreted imaging which showed distal radial fracture, mild angulation I agree with the radiologist interpretation     Problem List / ED Course / Critical interventions / Medication management  Fall, mechanical No head injury No dementia - considered reliable historian Injury felt isolated to left wrist.   I ordered medication including morphine  for pain  Reevaluation of the patient after these medicines  showed that the patient improved I have reviewed the patients home medicines and have made adjustments as needed   Social Determinants of Health:  Does not walk with walker or cane requiring use of left hand   Test / Admission - Considered:  Splint applied to left wrist. She has been seen by Dr. Charm Barges and is felt appropriate for discharge, ortho follow up. Will supply pain relief.     Amount and/or Complexity of Data Reviewed Radiology: ordered.  Risk Prescription drug management.           Final Clinical Impression(s) / ED Diagnoses Final diagnoses:  Closed Colles' fracture of left radius, initial encounter    Rx / DC Orders ED Discharge Orders          Ordered  traMADol (ULTRAM) 50 MG tablet  Every 8 hours PRN        06/16/23 1838              Elpidio Anis, PA-C 06/16/23 1850    Terrilee Files, MD 06/17/23 [id number]

## 2023-06-16 NOTE — ED Triage Notes (Addendum)
Pt POV from home after slipping on acorn in garage. L wrist deformity, abrasion L knee. Pt on xarelto, no bleeding at this time. Did not hit head, -LOC

## 2023-06-16 NOTE — Discharge Instructions (Addendum)
Please call Dr. Merrilee Seashore office to schedule an appointment early next week. Ice and elevate the wrist to reduce/control swelling. You can take tramadol for pain as directed. It is recommended that, since pain relievers can cause constipation, you take a stool softener while on Tramadol. You can add Tylenol as well, if needed.

## 2023-06-17 DIAGNOSIS — J449 Chronic obstructive pulmonary disease, unspecified: Secondary | ICD-10-CM | POA: Diagnosis not present

## 2023-06-17 DIAGNOSIS — Z7901 Long term (current) use of anticoagulants: Secondary | ICD-10-CM | POA: Diagnosis not present

## 2023-06-17 DIAGNOSIS — X58XXXA Exposure to other specified factors, initial encounter: Secondary | ICD-10-CM | POA: Diagnosis not present

## 2023-06-17 DIAGNOSIS — Z8679 Personal history of other diseases of the circulatory system: Secondary | ICD-10-CM | POA: Diagnosis not present

## 2023-06-17 DIAGNOSIS — S52592A Other fractures of lower end of left radius, initial encounter for closed fracture: Secondary | ICD-10-CM | POA: Diagnosis not present

## 2023-06-17 DIAGNOSIS — Z79899 Other long term (current) drug therapy: Secondary | ICD-10-CM | POA: Diagnosis not present

## 2023-06-17 DIAGNOSIS — S52502A Unspecified fracture of the lower end of left radius, initial encounter for closed fracture: Secondary | ICD-10-CM | POA: Diagnosis not present

## 2023-06-17 DIAGNOSIS — S52612A Displaced fracture of left ulna styloid process, initial encounter for closed fracture: Secondary | ICD-10-CM | POA: Diagnosis not present

## 2023-06-17 DIAGNOSIS — S52615A Nondisplaced fracture of left ulna styloid process, initial encounter for closed fracture: Secondary | ICD-10-CM | POA: Diagnosis not present

## 2023-06-20 DIAGNOSIS — S52572A Other intraarticular fracture of lower end of left radius, initial encounter for closed fracture: Secondary | ICD-10-CM | POA: Diagnosis not present

## 2023-06-21 DIAGNOSIS — H353211 Exudative age-related macular degeneration, right eye, with active choroidal neovascularization: Secondary | ICD-10-CM | POA: Diagnosis not present

## 2023-06-25 ENCOUNTER — Other Ambulatory Visit: Payer: Self-pay | Admitting: Cardiology

## 2023-06-26 NOTE — Telephone Encounter (Signed)
Prescription refill request for Xarelto received.  Indication:afib Last office visit:10/24 Weight:54.9  kg Age:89 Scr:0.66  8/24 CrCl:51.06  ml/min  Prescription refilled

## 2023-06-27 DIAGNOSIS — S52572A Other intraarticular fracture of lower end of left radius, initial encounter for closed fracture: Secondary | ICD-10-CM | POA: Diagnosis not present

## 2023-06-27 DIAGNOSIS — M79642 Pain in left hand: Secondary | ICD-10-CM | POA: Diagnosis not present

## 2023-07-10 DIAGNOSIS — S52572A Other intraarticular fracture of lower end of left radius, initial encounter for closed fracture: Secondary | ICD-10-CM | POA: Diagnosis not present

## 2023-08-01 DIAGNOSIS — S52572A Other intraarticular fracture of lower end of left radius, initial encounter for closed fracture: Secondary | ICD-10-CM | POA: Diagnosis not present

## 2023-08-17 ENCOUNTER — Other Ambulatory Visit: Payer: Self-pay | Admitting: Cardiology

## 2023-08-23 DIAGNOSIS — H353211 Exudative age-related macular degeneration, right eye, with active choroidal neovascularization: Secondary | ICD-10-CM | POA: Diagnosis not present

## 2023-09-04 ENCOUNTER — Ambulatory Visit: Payer: Medicare Other | Admitting: Pulmonary Disease

## 2023-09-11 ENCOUNTER — Ambulatory Visit: Payer: Medicare Other | Admitting: Pulmonary Disease

## 2023-09-13 DIAGNOSIS — E785 Hyperlipidemia, unspecified: Secondary | ICD-10-CM | POA: Diagnosis not present

## 2023-09-13 DIAGNOSIS — R748 Abnormal levels of other serum enzymes: Secondary | ICD-10-CM | POA: Diagnosis not present

## 2023-09-14 LAB — NUCLEOTIDASE, 5', BLOOD: 5-Nucleotidase: 5 IU/L (ref 0–15)

## 2023-09-14 LAB — HEPATIC FUNCTION PANEL
ALT: 15 [IU]/L (ref 0–32)
AST: 22 [IU]/L (ref 0–40)
Albumin: 4.4 g/dL (ref 3.7–4.7)
Alkaline Phosphatase: 150 [IU]/L — ABNORMAL HIGH (ref 44–121)
Bilirubin Total: 0.7 mg/dL (ref 0.0–1.2)
Bilirubin, Direct: 0.3 mg/dL (ref 0.00–0.40)
Total Protein: 6.8 mg/dL (ref 6.0–8.5)

## 2023-09-14 LAB — LIPID PANEL
Chol/HDL Ratio: 2.1 {ratio} (ref 0.0–4.4)
Cholesterol, Total: 158 mg/dL (ref 100–199)
HDL: 75 mg/dL (ref >39)
LDL Chol Calc (NIH): 69 mg/dL (ref 0–99)
Triglycerides: 71 mg/dL (ref 0–149)
VLDL Cholesterol Cal: 14 mg/dL (ref 5–40)

## 2023-09-14 LAB — GAMMA GT: GGT: 83 [IU]/L — ABNORMAL HIGH (ref 0–60)

## 2023-09-18 ENCOUNTER — Telehealth: Payer: Self-pay | Admitting: Cardiology

## 2023-09-18 NOTE — Telephone Encounter (Signed)
Called and spoke to patient. Verified name and DOB. Below message relayed per Dr Jens Som. Patient stated she will talk to her daughter about seeing the GI doctor and call back if they decide to proceed.  Nichole Bunting, MD 09/14/2023 11:43 AM EST   GGT and alkaline phosphatase remain mildly elevated.  If patient willing would ask her to see gastroenterology. Nichole Lawrence

## 2023-09-18 NOTE — Telephone Encounter (Signed)
 Pt returning call to a nurse for results

## 2023-09-26 DIAGNOSIS — H6191 Disorder of right external ear, unspecified: Secondary | ICD-10-CM | POA: Diagnosis not present

## 2023-09-27 DIAGNOSIS — H9221 Otorrhagia, right ear: Secondary | ICD-10-CM | POA: Diagnosis not present

## 2023-09-27 DIAGNOSIS — H6121 Impacted cerumen, right ear: Secondary | ICD-10-CM | POA: Diagnosis not present

## 2023-09-27 DIAGNOSIS — H93291 Other abnormal auditory perceptions, right ear: Secondary | ICD-10-CM | POA: Diagnosis not present

## 2023-09-27 DIAGNOSIS — H90A22 Sensorineural hearing loss, unilateral, left ear, with restricted hearing on the contralateral side: Secondary | ICD-10-CM | POA: Diagnosis not present

## 2023-09-27 DIAGNOSIS — H90A31 Mixed conductive and sensorineural hearing loss, unilateral, right ear with restricted hearing on the contralateral side: Secondary | ICD-10-CM | POA: Diagnosis not present

## 2023-09-29 ENCOUNTER — Encounter: Payer: Self-pay | Admitting: Pulmonary Disease

## 2023-09-29 ENCOUNTER — Ambulatory Visit: Payer: Medicare Other | Admitting: Pulmonary Disease

## 2023-09-29 VITALS — BP 124/66 | HR 81 | Ht 66.0 in | Wt 118.0 lb

## 2023-09-29 DIAGNOSIS — J439 Emphysema, unspecified: Secondary | ICD-10-CM

## 2023-09-29 DIAGNOSIS — J479 Bronchiectasis, uncomplicated: Secondary | ICD-10-CM

## 2023-09-29 NOTE — Progress Notes (Signed)
 Nichole Lawrence    [id number]    Jul 28, 1933  Primary Care Physician:Ramachandran, Campbell Lerner, MD  Referring Physician: Georgianne Fick, MD [address] [place],  [place] [address]  Chief complaint: Follow-up for emphysema, bronchiectasis, lung nodules.  HPI: 89 y.o.  with history of pulmonary emphysema, bronchiectasis, pulmonary nodules.  Previously followed by Dr. Jamison Neighbor She is a non-smoker with exposure to secondhand smoke.  No evidence of alpha-1 antitrypsin deficiency.  Seen in pulmonary clinic in May 2019 with pneumonia, bronchiectasis exacerbation. CT at that time showed worsening pulmonary opacities.  However follow-up chest x-ray showed improvement after treatment with antibiotics  Pets: Dog, no cats, birds. 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 Exposures: No known exposures, no mold, hot tub, Jacuzzi Smoking history: Never smoker exposed to secondhand smoke  Travel history: Originally from [place]. Relevant family history: Father had COPD.  He was a smoker.  Interim history: Discussed the use of AI scribe software for clinical note transcription with the patient, who gave verbal consent to proceed.  Nichole Lawrence is an 89 year old female with emphysema and bronchiectasis who presents with breathing difficulties in the morning.  She experiences breathing difficulties primarily in the morning, described as feeling similar to a panic attack. These episodes are accompanied by lightheadedness and resolve with rest. She is uncertain if these symptoms are related to her brain, heart, or lungs.  She has a history of emphysema and bronchiectasis. Lung function tests from 2024 were normal. She uses the inhaler Anoro once daily for symptom management, although she is uncertain of its current effectiveness. She previously tried Clinical cytogeneticist but experienced allergic reactions.  She is aware of a possible chronic infection with mycobacteria,  which is being monitored without active treatment due to potential side effects. She remains asymptomatic and maintains a good quality of life.  She participates in exercise classes and walks a mile to a mile and a half a couple of times a week, indicating a good level of physical activity.    Outpatient Encounter Medications as of 09/29/2023  Medication Sig   albuterol (VENTOLIN HFA) 108 (90 Base) MCG/ACT inhaler Inhale 2 puffs into the lungs every 6 (six) hours as needed for wheezing or shortness of breath.   atorvastatin (LIPITOR) 20 MG tablet Take 1 tablet (20 mg total) by mouth daily.   diltiazem (CARTIA XT) 180 MG 24 hr capsule Take 1 capsule by mouth once daily   losartan (COZAAR) 25 MG tablet Take 1/2 (one-half) tablet by mouth once daily   MAGNESIUM SULFATE PO Take 250 mg by mouth daily.   metoprolol tartrate (LOPRESSOR) 100 MG tablet TAKE 2 HOURS PRIOR TO CT SCAN   Multiple Vitamin (MULTIVITAMIN) capsule Take 1 capsule by mouth daily.   Respiratory Therapy Supplies (FLUTTER) DEVI Three times a day for cough and congestion   Rivaroxaban (XARELTO) 15 MG TABS tablet Take 1 tablet by mouth once daily   umeclidinium-vilanterol (ANORO ELLIPTA) 62.5-25 MCG/ACT AEPB Inhale 1 puff into the lungs daily.   [DISCONTINUED] traMADol (ULTRAM) 50 MG tablet Take 1 tablet (50 mg total) by mouth every 8 (eight) hours as needed.   No facility-administered encounter medications on file as of 09/29/2023.   Physical Exam: Blood pressure 124/66, pulse 81, height 5\' 6"  (1.676 m), weight 118 lb (53.5 kg), SpO2 96%. Gen:      No acute distress HEENT:  EOMI, sclera anicteric Neck:  No masses; no thyromegaly Lungs:    Clear to auscultation bilaterally; normal respiratory effort CV:         Regular rate and rhythm; no murmurs Abd:      + bowel sounds; soft, non-tender; no palpable masses, no distension Ext:    No edema; adequate peripheral perfusion Skin:      Warm and dry; no rash Neuro: alert and  oriented x 3 Psych: normal mood and affect   Data Reviewed: Imaging CT CHEST W/ CONTRAST 07/01/15   nodularity noted in bilateral lungs and multiple lobes upper, middle, lingula, and lower. No pathologic mediastinal adenopathy. Largest nodule measures 1 cm in right upper lobe with some spiculation. No pleural effusion or thickening. No pericardial effusion. very mild apical predominant emphysema. Mild pectus excavatum.   CT CHEST W/O 05/12/17   New 6 mm nodule in left lower lobe. Diffuse bronchial wall thickening with findings consistent with bronchiectasis and right middle and upper lobes bilaterally. Tree-in-bud opacities predominantly within right upper and right middle lobes. No pleural effusion or thickening. No pericardial effusion. No pathologic mediastinal adenopathy.   CT chest without contrast 12/19/2017- 17 but opacity, patchy consolidation, groundglass attenuation, nodularity, bronchiectasis.  Stable 6 mm left lower lobe nodule.  CT high-resolution 02/21/2019- no interstitial lung disease.  Nodules, tree-in-bud bilaterally.  More than the right upper and middle lobe.  Improved compared to prior.  CT chest 05/20/2021-Spectrum findings with chronic nodularity, tree-in-bud, bronchiectasis Slightly worsened  CT high-resolution 12/13/2022-slight increase in bronchiectasis, peribronchovascular nodularity, tree-in-bud.  CT coronaries 03/14/2023-visualized lungs showed stable changes. I have reviewed the images personally.  PFT 06/12/17: FVC 2.13 L (74%) FEV1 1.76 L (82%) FEV1/FVC 0.83 FEF 25-75 1.70 L (118%) positive bronchodilator response TLC 5.67 L (103%) RV 134% ERV 80% DLCO corrected 53% 07/30/15: FVC 2.68 L (90%) FEV1 1.94 L (88%) FEV1/FVC 0.72 FEF 25-75 1.32 L (85%) negative bronchodilator response TLC 6.49 L (117%) RV 149% ERV 117% DLCO uncorrected 54%  02/23/2023 FVC 2.68 [107%], FEV1 2.11 [114%], F/F79, TLC 6.39 [119%], DLCO 16.30 [83%] Normal test  LABS 05/11/17 Alpha-1  antitrypsin: MM (154) ESR: 3 CRP: 0.3 ANA:  1:160, speckled RA:  <14 Anti-CCP:  <16   Assessment:  Emphysema Mild emphysema secondary to smoking, with no reduction in lung function per 2024 test. Efficacy of Anoro is uncertain. Discussed stopping Anoro to evaluate necessity. - Discontinue Anoro for 1-2 weeks to evaluate symptom changes - Resume Anoro if symptoms worsen and notify via MyChart  Bronchiectasis Well-managed bronchiectasis with possible chronic mycobacterial infection. Asymptomatic with good quality of life. Treatment deferred due to potential side effects and current stability. - No need for CT scan this year unless symptoms worsen - Follow-up if increased shortness of breath or cough  General Health Maintenance Maintaining an active lifestyle with regular exercise. - Continue current exercise regimen  Follow-up - Schedule follow-up visit in one year - Offer virtual visit option for routine check-in, especially on Friday afternoons.    Plan/Recommendations: - Trial off anoro inhaler - Flutter valve, Mucinex - OTC steroid nasal spray, antihistamine  Chilton Greathouse MD [facility] 09/29/2023, 9:03 AM  CC: Georgianne Fick, MD

## 2023-09-29 NOTE — Patient Instructions (Signed)
I am glad you are doing with your breathing We can stop the Anoro for a few weeks to see if your breathing is stable.  As I do not know if she will need to use this inhaler long-term Continue to monitor your symptoms Follow-up in 1 year.

## 2023-10-05 DIAGNOSIS — H90A22 Sensorineural hearing loss, unilateral, left ear, with restricted hearing on the contralateral side: Secondary | ICD-10-CM | POA: Diagnosis not present

## 2023-10-05 DIAGNOSIS — H9221 Otorrhagia, right ear: Secondary | ICD-10-CM | POA: Diagnosis not present

## 2023-10-18 DIAGNOSIS — H353211 Exudative age-related macular degeneration, right eye, with active choroidal neovascularization: Secondary | ICD-10-CM | POA: Diagnosis not present

## 2023-10-23 ENCOUNTER — Other Ambulatory Visit: Payer: Self-pay | Admitting: Pulmonary Disease

## 2023-10-25 ENCOUNTER — Other Ambulatory Visit: Payer: Self-pay | Admitting: Pulmonary Disease

## 2023-10-25 ENCOUNTER — Encounter: Payer: Self-pay | Admitting: Pulmonary Disease

## 2023-10-26 MED ORDER — ANORO ELLIPTA 62.5-25 MCG/ACT IN AEPB: 1.0000 | INHALATION_SPRAY | Freq: Every day | RESPIRATORY_TRACT | 3 refills | Status: AC

## 2023-11-14 DIAGNOSIS — H353121 Nonexudative age-related macular degeneration, left eye, early dry stage: Secondary | ICD-10-CM | POA: Diagnosis not present

## 2023-12-13 DIAGNOSIS — H353211 Exudative age-related macular degeneration, right eye, with active choroidal neovascularization: Secondary | ICD-10-CM | POA: Diagnosis not present

## 2024-01-17 ENCOUNTER — Other Ambulatory Visit: Payer: Self-pay | Admitting: Cardiology

## 2024-01-17 DIAGNOSIS — I480 Paroxysmal atrial fibrillation: Secondary | ICD-10-CM

## 2024-02-06 DIAGNOSIS — Z85820 Personal history of malignant melanoma of skin: Secondary | ICD-10-CM | POA: Diagnosis not present

## 2024-02-06 DIAGNOSIS — D692 Other nonthrombocytopenic purpura: Secondary | ICD-10-CM | POA: Diagnosis not present

## 2024-02-06 DIAGNOSIS — L570 Actinic keratosis: Secondary | ICD-10-CM | POA: Diagnosis not present

## 2024-02-06 DIAGNOSIS — L821 Other seborrheic keratosis: Secondary | ICD-10-CM | POA: Diagnosis not present

## 2024-02-06 DIAGNOSIS — C44519 Basal cell carcinoma of skin of other part of trunk: Secondary | ICD-10-CM | POA: Diagnosis not present

## 2024-02-06 DIAGNOSIS — Z85828 Personal history of other malignant neoplasm of skin: Secondary | ICD-10-CM | POA: Diagnosis not present

## 2024-02-06 DIAGNOSIS — I8392 Asymptomatic varicose veins of left lower extremity: Secondary | ICD-10-CM | POA: Diagnosis not present

## 2024-02-21 DIAGNOSIS — H353211 Exudative age-related macular degeneration, right eye, with active choroidal neovascularization: Secondary | ICD-10-CM | POA: Diagnosis not present

## 2024-03-26 DIAGNOSIS — C44729 Squamous cell carcinoma of skin of left lower limb, including hip: Secondary | ICD-10-CM | POA: Diagnosis not present

## 2024-04-24 DIAGNOSIS — H353211 Exudative age-related macular degeneration, right eye, with active choroidal neovascularization: Secondary | ICD-10-CM | POA: Diagnosis not present

## 2024-05-21 DIAGNOSIS — E782 Mixed hyperlipidemia: Secondary | ICD-10-CM | POA: Diagnosis not present

## 2024-05-21 DIAGNOSIS — N182 Chronic kidney disease, stage 2 (mild): Secondary | ICD-10-CM | POA: Diagnosis not present

## 2024-05-21 DIAGNOSIS — I480 Paroxysmal atrial fibrillation: Secondary | ICD-10-CM | POA: Diagnosis not present

## 2024-05-21 DIAGNOSIS — R5383 Other fatigue: Secondary | ICD-10-CM | POA: Diagnosis not present

## 2024-05-27 ENCOUNTER — Other Ambulatory Visit: Payer: Self-pay | Admitting: Cardiology

## 2024-05-28 DIAGNOSIS — R072 Precordial pain: Secondary | ICD-10-CM | POA: Diagnosis not present

## 2024-05-28 DIAGNOSIS — D6869 Other thrombophilia: Secondary | ICD-10-CM | POA: Diagnosis not present

## 2024-05-28 DIAGNOSIS — Z23 Encounter for immunization: Secondary | ICD-10-CM | POA: Diagnosis not present

## 2024-05-28 DIAGNOSIS — N182 Chronic kidney disease, stage 2 (mild): Secondary | ICD-10-CM | POA: Diagnosis not present

## 2024-05-28 DIAGNOSIS — D692 Other nonthrombocytopenic purpura: Secondary | ICD-10-CM | POA: Diagnosis not present

## 2024-05-28 DIAGNOSIS — J432 Centrilobular emphysema: Secondary | ICD-10-CM | POA: Diagnosis not present

## 2024-05-28 DIAGNOSIS — E782 Mixed hyperlipidemia: Secondary | ICD-10-CM | POA: Diagnosis not present

## 2024-05-28 DIAGNOSIS — I480 Paroxysmal atrial fibrillation: Secondary | ICD-10-CM | POA: Diagnosis not present

## 2024-05-28 DIAGNOSIS — I700 Atherosclerosis of aorta: Secondary | ICD-10-CM | POA: Diagnosis not present

## 2024-05-28 DIAGNOSIS — Z0000 Encounter for general adult medical examination without abnormal findings: Secondary | ICD-10-CM | POA: Diagnosis not present

## 2024-05-28 DIAGNOSIS — M810 Age-related osteoporosis without current pathological fracture: Secondary | ICD-10-CM | POA: Diagnosis not present

## 2024-07-08 ENCOUNTER — Other Ambulatory Visit (HOSPITAL_BASED_OUTPATIENT_CLINIC_OR_DEPARTMENT_OTHER): Payer: Self-pay | Admitting: Cardiology

## 2024-07-19 NOTE — Progress Notes (Unsigned)
 HPI: FU atrial fibrillation and SVT. Pulmonary function tests October 2018 showed moderately decreased DLCO and restrictive disease. High-resolution CT April 2024 showed probable findings suggestive of MAI infection, coronary calcification, ectasia of the ascending aorta at 4 cm.  Coronary CTA July 2024 showed calcium  score 78 which was 34th percentile and minimal plaque in the LAD.  Echocardiogram August 2024 showed normal LV function, grade 1 diastolic dysfunction, mild to moderate aortic insufficiency.  Since last seen she has dyspnea with more vigorous activities but not routine activities.  No orthopnea, PND, pedal edema, exertional chest pain or syncope.  No bleeding.  Current Outpatient Medications  Medication Sig Dispense Refill   albuterol  (VENTOLIN  HFA) 108 (90 Base) MCG/ACT inhaler Inhale 2 puffs into the lungs every 6 (six) hours as needed for wheezing or shortness of breath. 8 g 6   atorvastatin  (LIPITOR) 20 MG tablet Take 1 tablet (20 mg total) by mouth daily. 90 tablet 3   diltiazem  (CARDIZEM  CD) 180 MG 24 hr capsule Take 1 capsule by mouth once daily 90 capsule 0   losartan  (COZAAR ) 25 MG tablet Take 1/2 (one-half) tablet by mouth once daily 60 tablet 0   MAGNESIUM  SULFATE PO Take 250 mg by mouth daily.     metoprolol  tartrate (LOPRESSOR ) 100 MG tablet TAKE 2 HOURS PRIOR TO CT SCAN 1 tablet 0   Multiple Vitamin (MULTIVITAMIN) capsule Take 1 capsule by mouth daily.     Respiratory Therapy Supplies (FLUTTER) DEVI Three times a day for cough and congestion 1 each 0   Rivaroxaban  (XARELTO ) 15 MG TABS tablet Take 1 tablet by mouth once daily 30 tablet 5   umeclidinium-vilanterol (ANORO ELLIPTA ) 62.5-25 MCG/ACT AEPB Inhale 1 puff into the lungs daily. 90 each 3   No current facility-administered medications for this visit.     Past Medical History:  Diagnosis Date   Breast cancer (HCC)    SVT (supraventricular tachycardia)     Past Surgical History:  Procedure Laterality  Date   BACK SURGERY     MASTECTOMY Right    TONSILLECTOMY     TUBAL LIGATION      Social History   Socioeconomic History   Marital status: Widowed    Spouse name: Not on file   Number of children: 2   Years of education: Not on file   Highest education level: Not on file  Occupational History   Occupation: Retired [employer]ER  Tobacco Use   Smoking status: Never    Passive exposure: Yes   Smokeless tobacco: Never   Tobacco comments:    Husband and Father   Vaping Use   Vaping status: Never Used  Substance and Sexual Activity   Alcohol use: Yes    Alcohol/week: 0.0 standard drinks of alcohol    Comment: Occasional   Drug use: No   Sexual activity: Not on file  Other Topics Concern   Not on file  Social History Narrative   [facility] (05/11/17):   Originally from [place] Moved to [place] in 2006. 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 No known TB exposure. No mold, bird, or hot tub exposure. Remotely had a swimming pool. Enjoys exercising and walking. She does have a dog and enjoys gardening.    Social Drivers of Health   Tobacco Use: Medium Risk (07/25/2024)   Patient History    Smoking Tobacco Use: Never    Smokeless Tobacco Use: Never  Passive Exposure: Yes  Financial Resource Strain: Not on file  Food Insecurity: Low Risk (07/10/2023)   Received from [facility]   Epic    Within the past 12 months, you worried that your food would run out before you got money to buy more: Never true    Within the past 12 months, the food you bought just didn't last and you didn't have money to get more. : Never true  Transportation Needs: No Transportation Needs (07/10/2023)   Received from [facility]    In the past 12 months, has lack of reliable transportation kept you from medical appointments, meetings, work or from getting things needed for daily living? : No  Physical Activity: Not on file  Stress: Not on file   Social Connections: Not on file  Intimate Partner Violence: Not At Risk (06/17/2023)   Received from [facility]   HITS    Over the last 12 months how often did your partner physically hurt you?: Never    Over the last 12 months how often did your partner insult you or talk down to you?: Never    Over the last 12 months how often did your partner threaten you with physical harm?: Never    Over the last 12 months how often did your partner scream or curse at you?: Never  Depression (PHQ2-9): Not on file  Alcohol Screen: Not on file  Housing: Low Risk (07/10/2023)   Received from [facility]   Epic    What is your living situation today?: I have a steady place to live    Think about the place you live. Do you have problems with any of the following? Choose all that apply:: None/None on this list  Utilities: Low Risk (07/10/2023)   Received from [facility]   Utilities    In the past 12 months has the electric, gas, oil, or water company threatened to shut off services in your home? : No  Health Literacy: Not on file    Family History  Problem Relation Age of Onset   Heart disease Mother    Emphysema Mother        never smoked   Rheum arthritis Mother    Breast cancer Mother    Lung cancer Father    CAD Brother     ROS: no fevers or chills, productive cough, hemoptysis, dysphasia, odynophagia, melena, hematochezia, dysuria, hematuria, rash, seizure activity, orthopnea, PND, pedal edema, claudication. Remaining systems are negative.  Physical Exam: Well-developed well-nourished in no acute distress.  Skin is warm and dry.  HEENT is normal.  Neck is supple.  Chest is clear to auscultation with normal expansion.  Cardiovascular exam is regular rate and rhythm.  Abdominal exam nontender or distended. No masses palpated. Extremities show no edema. neuro grossly intact  EKG Interpretation Date/Time:  Thursday July 25 2024 07:53:27 EST Ventricular Rate:  85 PR  Interval:  274 QRS Duration:  82 QT Interval:  374 QTC Calculation: 445 R Axis:   -77  Text Interpretation: Sinus rhythm with 1st degree A-V block Left anterior fasicular block Septal infarct Confirmed by Pietro Rogue ([id number]) on 07/25/2024 7:54:30 AM    A/P  1 paroxysmal atrial fibrillation-patient is in sinus rhythm today.  Will continue Xarelto  and Cardizem .    2 aortic insufficiency-mild to moderate on last echocardiogram.  Will repeat study.  3 coronary calcification-she is not having chest pain.  Continue statin.  4 hypertension-patient's blood pressure is controlled.  Continue present medical regimen.  5 hyperlipidemia-continue statin.    6 dyspnea-this is felt secondary to COPD/bronchiectasis.  Redell Shallow, MD

## 2024-07-25 ENCOUNTER — Encounter: Payer: Self-pay | Admitting: Cardiology

## 2024-07-25 ENCOUNTER — Ambulatory Visit: Attending: Internal Medicine | Admitting: Cardiology

## 2024-07-25 VITALS — BP 128/81 | HR 83 | Ht 66.0 in | Wt 120.5 lb

## 2024-07-25 DIAGNOSIS — I359 Nonrheumatic aortic valve disorder, unspecified: Secondary | ICD-10-CM

## 2024-07-25 DIAGNOSIS — I480 Paroxysmal atrial fibrillation: Secondary | ICD-10-CM

## 2024-07-25 DIAGNOSIS — E785 Hyperlipidemia, unspecified: Secondary | ICD-10-CM | POA: Diagnosis not present

## 2024-07-25 DIAGNOSIS — I2510 Atherosclerotic heart disease of native coronary artery without angina pectoris: Secondary | ICD-10-CM | POA: Diagnosis not present

## 2024-07-25 NOTE — Patient Instructions (Signed)

## 2024-07-29 ENCOUNTER — Other Ambulatory Visit: Payer: Self-pay | Admitting: Cardiology

## 2024-07-29 DIAGNOSIS — I480 Paroxysmal atrial fibrillation: Secondary | ICD-10-CM

## 2024-07-29 NOTE — Telephone Encounter (Signed)
 Prescription refill request for Xarelto  received.  Indication:afib Last office visit:12/25 Weight:54.7  kg Age:89 Scr:0.63  2025 CrCl:52.28  ml/min  Prescription refilled

## 2024-08-14 ENCOUNTER — Other Ambulatory Visit: Payer: Self-pay | Admitting: Cardiology

## 2024-08-14 DIAGNOSIS — E785 Hyperlipidemia, unspecified: Secondary | ICD-10-CM

## 2024-08-14 DIAGNOSIS — I480 Paroxysmal atrial fibrillation: Secondary | ICD-10-CM

## 2024-08-28 ENCOUNTER — Other Ambulatory Visit: Payer: Self-pay | Admitting: Cardiology

## 2024-08-29 NOTE — Telephone Encounter (Signed)
 In accordance with refill protocols, please review and address the following requirements before this medication refill can be authorized:  Labs

## 2024-09-03 ENCOUNTER — Ambulatory Visit (HOSPITAL_COMMUNITY)
Admission: RE | Admit: 2024-09-03 | Discharge: 2024-09-03 | Disposition: A | Discharge status: Home / Self Care | Source: Ambulatory Visit | Attending: Cardiology | Admitting: Cardiology

## 2024-09-03 DIAGNOSIS — I359 Nonrheumatic aortic valve disorder, unspecified: Secondary | ICD-10-CM | POA: Diagnosis not present

## 2024-09-04 LAB — ECHOCARDIOGRAM COMPLETE
Area-P 1/2: 7.22 cm2
P 1/2 time: 508 ms
S' Lateral: 2.4 cm

## 2024-09-05 ENCOUNTER — Ambulatory Visit: Payer: Self-pay | Admitting: Cardiology
